# Patient Record
Sex: Male | Born: 1960 | Race: White | Hispanic: No | Marital: Single | Smoking: Never smoker
Health system: Southern US, Community
[De-identification: ages and names within clinical notes are randomized; demographics above are authoritative.]

## PROBLEM LIST (undated history)

## (undated) DIAGNOSIS — F419 Anxiety disorder, unspecified: Secondary | ICD-10-CM

## (undated) DIAGNOSIS — F1010 Alcohol abuse, uncomplicated: Secondary | ICD-10-CM

## (undated) DIAGNOSIS — G039 Meningitis, unspecified: Secondary | ICD-10-CM

## (undated) HISTORY — DX: Meningitis, unspecified: G03.9

## (undated) HISTORY — DX: Anxiety disorder, unspecified: F41.9

---

## 1983-06-01 HISTORY — PX: ANTERIOR CRUCIATE LIGAMENT REPAIR: SHX115

## 2016-08-08 IMAGING — CR DG ANKLE COMPLETE 3+V*R*
3 series · 3 of 3 positions shown · non-contrast
Comparison: None.

CLINICAL DATA: Ankle pain

EXAM:
RIGHT ANKLE - COMPLETE 3+ VIEW

[ankle ap]
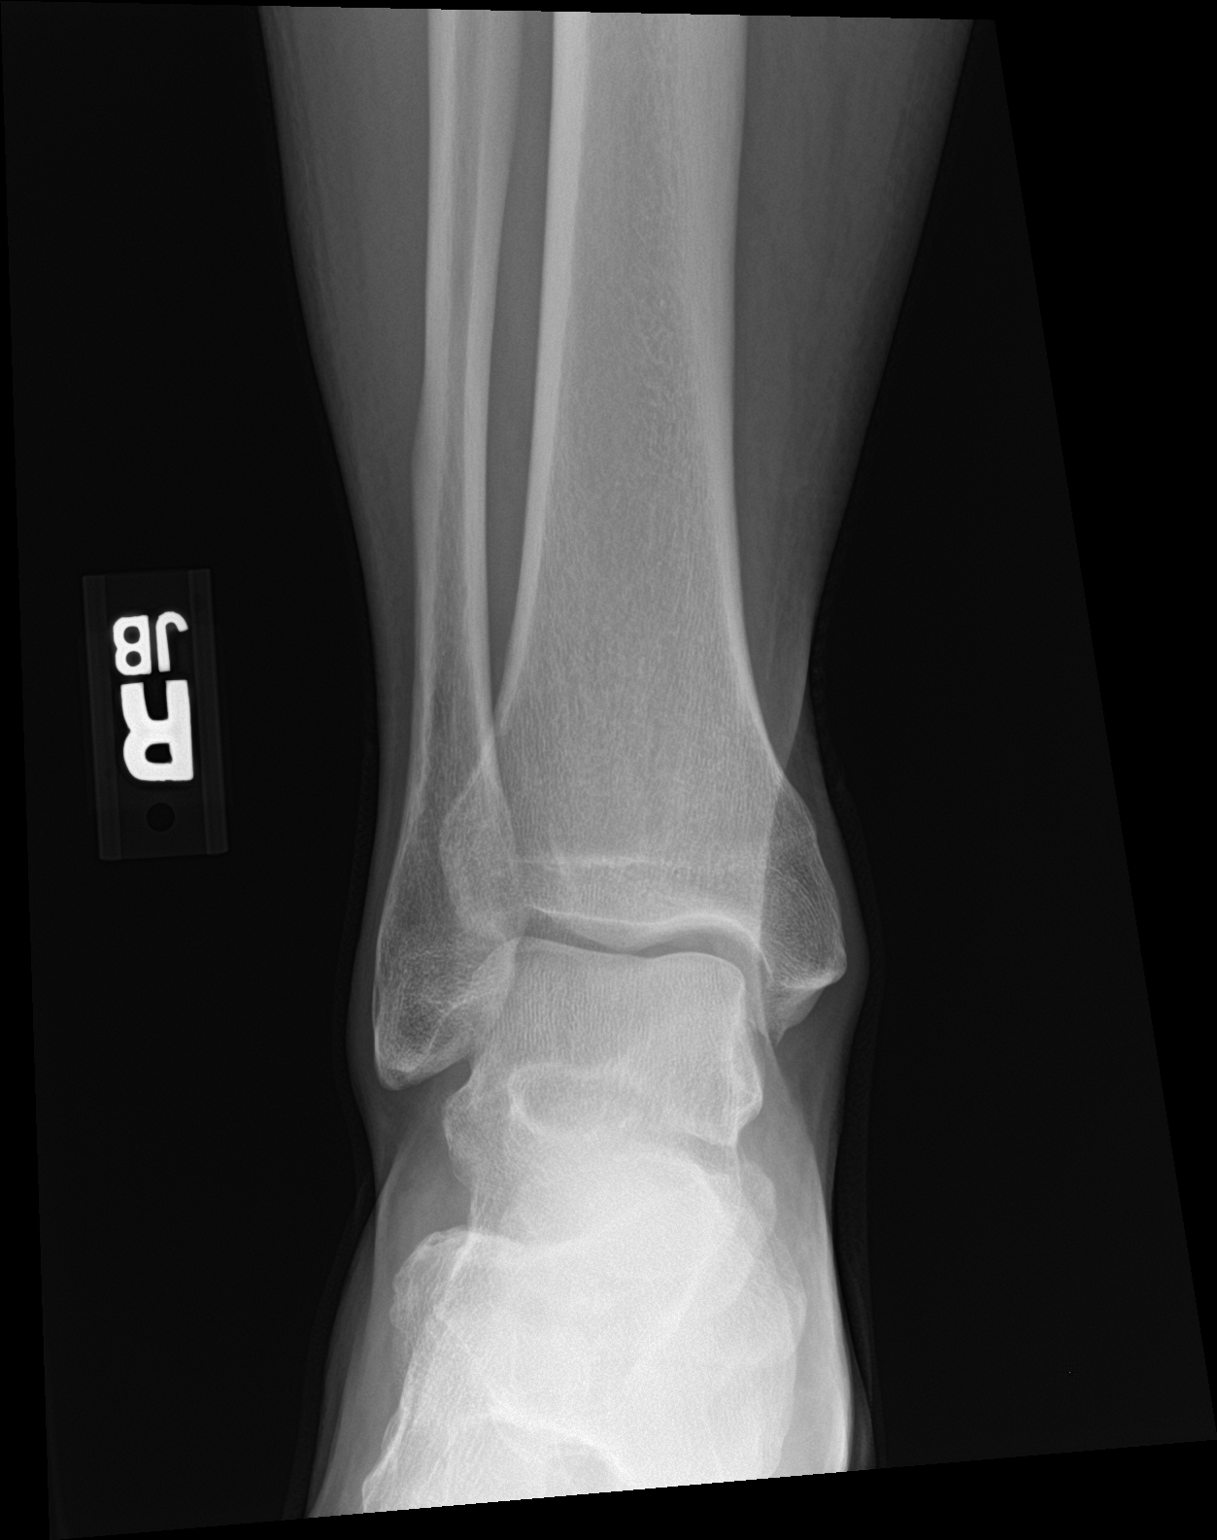

[ankle obl]
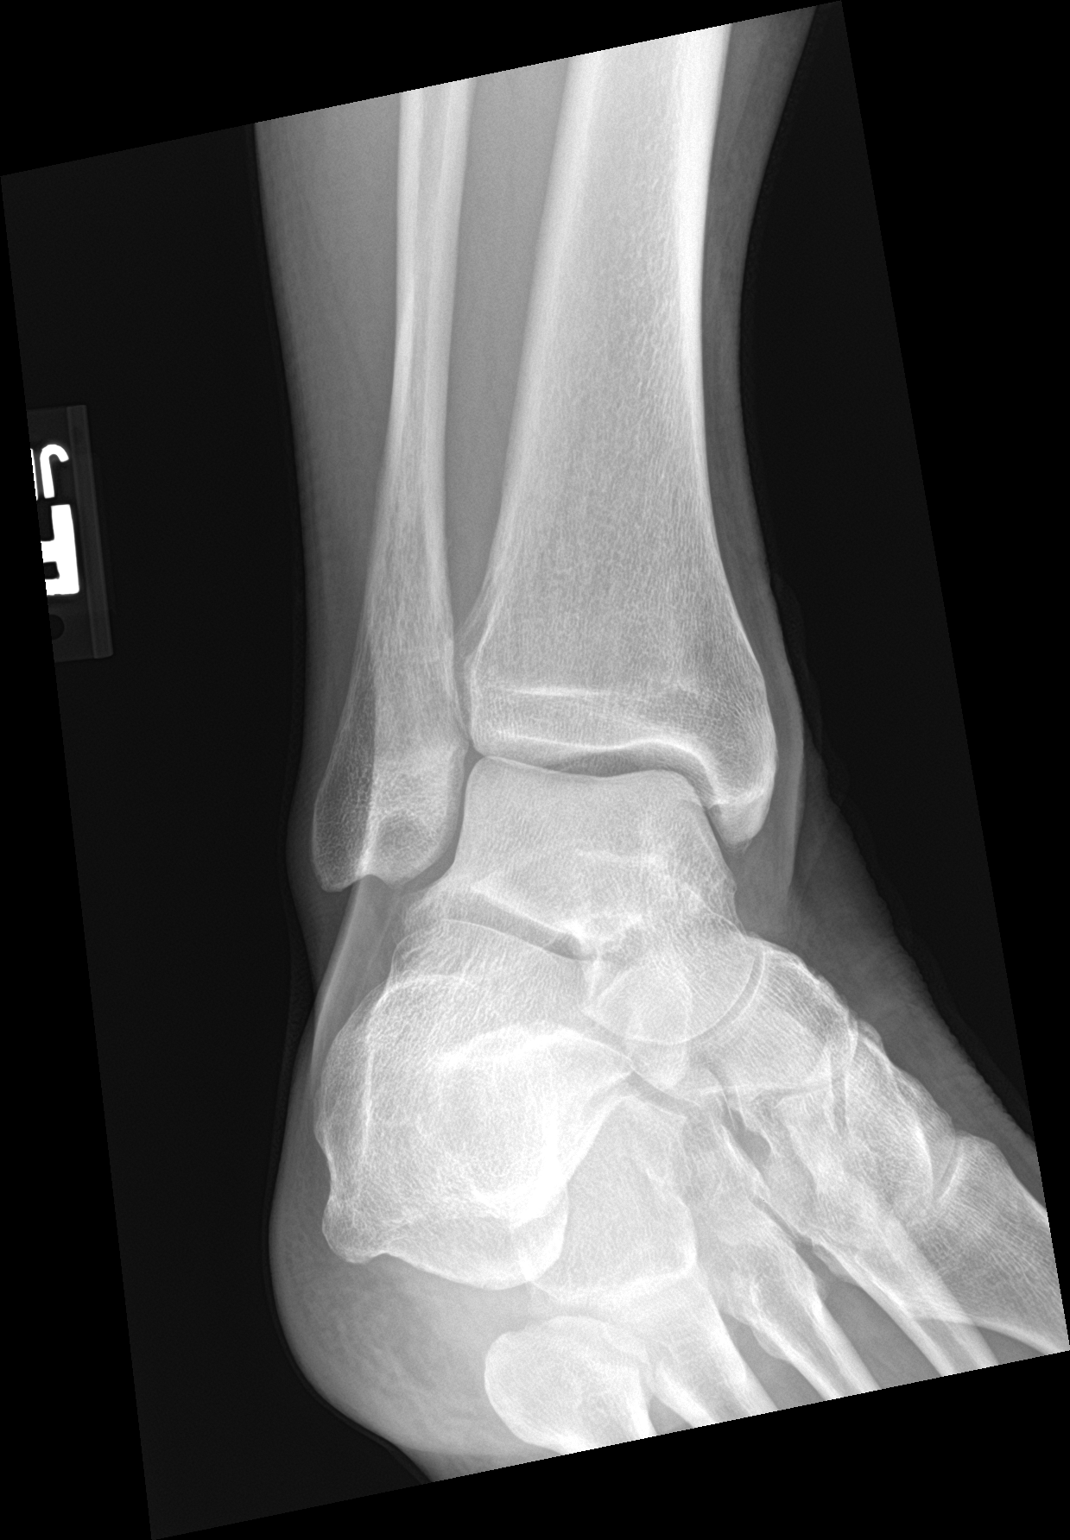

[ankle lat]
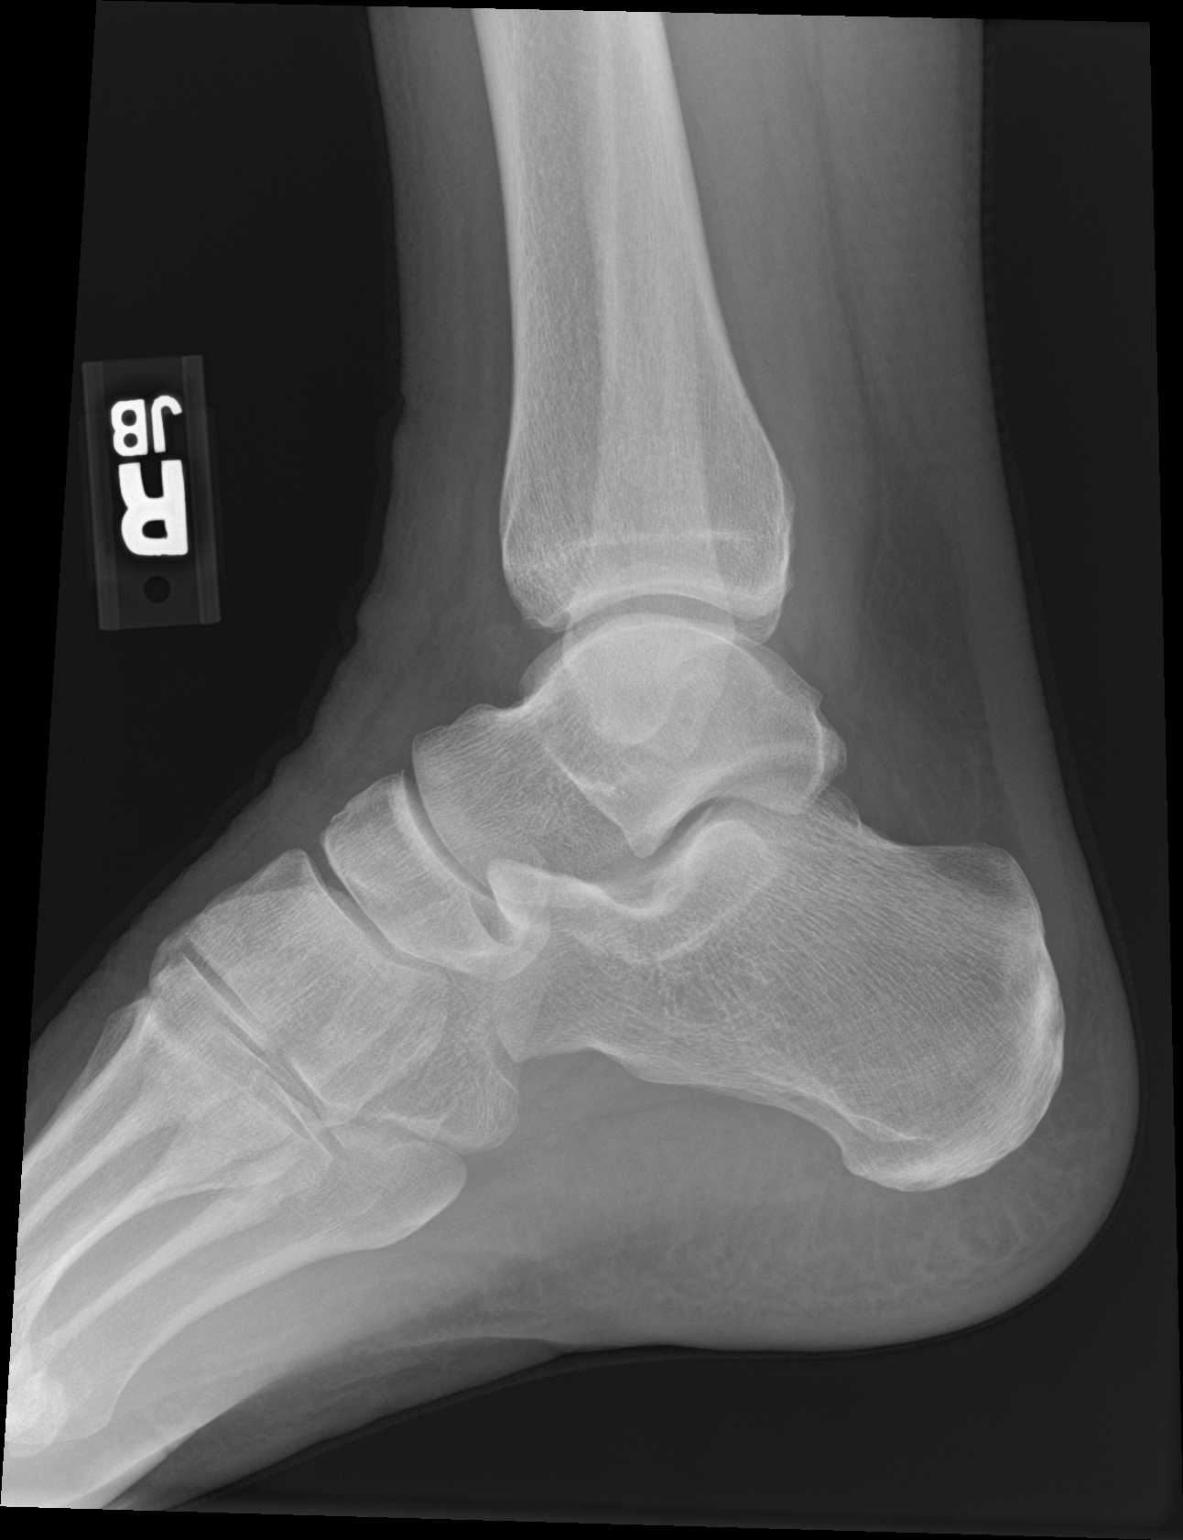

[3 of 3 positions shown; findings below may reference images not displayed]

FINDINGS: There is no evidence of fracture, dislocation, or joint effusion.
There is no evidence of arthropathy or other focal bone abnormality.
Soft tissues are unremarkable.
IMPRESSION: Negative.

## 2017-04-09 ENCOUNTER — Encounter (HOSPITAL_COMMUNITY): Payer: Self-pay | Admitting: *Deleted

## 2017-04-09 ENCOUNTER — Other Ambulatory Visit: Payer: Self-pay

## 2017-04-09 ENCOUNTER — Emergency Department (HOSPITAL_COMMUNITY): Payer: Self-pay

## 2017-04-09 ENCOUNTER — Emergency Department (HOSPITAL_COMMUNITY)
Admission: EM | Admit: 2017-04-09 | Discharge: 2017-04-09 | Disposition: A | Discharge status: Home / Self Care | Payer: Self-pay | Attending: Emergency Medicine | Admitting: Emergency Medicine

## 2017-04-09 DIAGNOSIS — Y9389 Activity, other specified: Secondary | ICD-10-CM | POA: Insufficient documentation

## 2017-04-09 DIAGNOSIS — Y929 Unspecified place or not applicable: Secondary | ICD-10-CM | POA: Insufficient documentation

## 2017-04-09 DIAGNOSIS — X509XXA Other and unspecified overexertion or strenuous movements or postures, initial encounter: Secondary | ICD-10-CM | POA: Insufficient documentation

## 2017-04-09 DIAGNOSIS — M25511 Pain in right shoulder: Secondary | ICD-10-CM

## 2017-04-09 DIAGNOSIS — M25571 Pain in right ankle and joints of right foot: Secondary | ICD-10-CM

## 2017-04-09 DIAGNOSIS — Y999 Unspecified external cause status: Secondary | ICD-10-CM | POA: Insufficient documentation

## 2017-04-09 IMAGING — CR DG SHOULDER 2+V*R*
3 series · 3 of 3 positions shown · non-contrast
Comparison: None.

CLINICAL DATA: Sharp pain to the shoulder after fall

EXAM:
RIGHT SHOULDER - 2+ VIEW

[shoulder grashey]
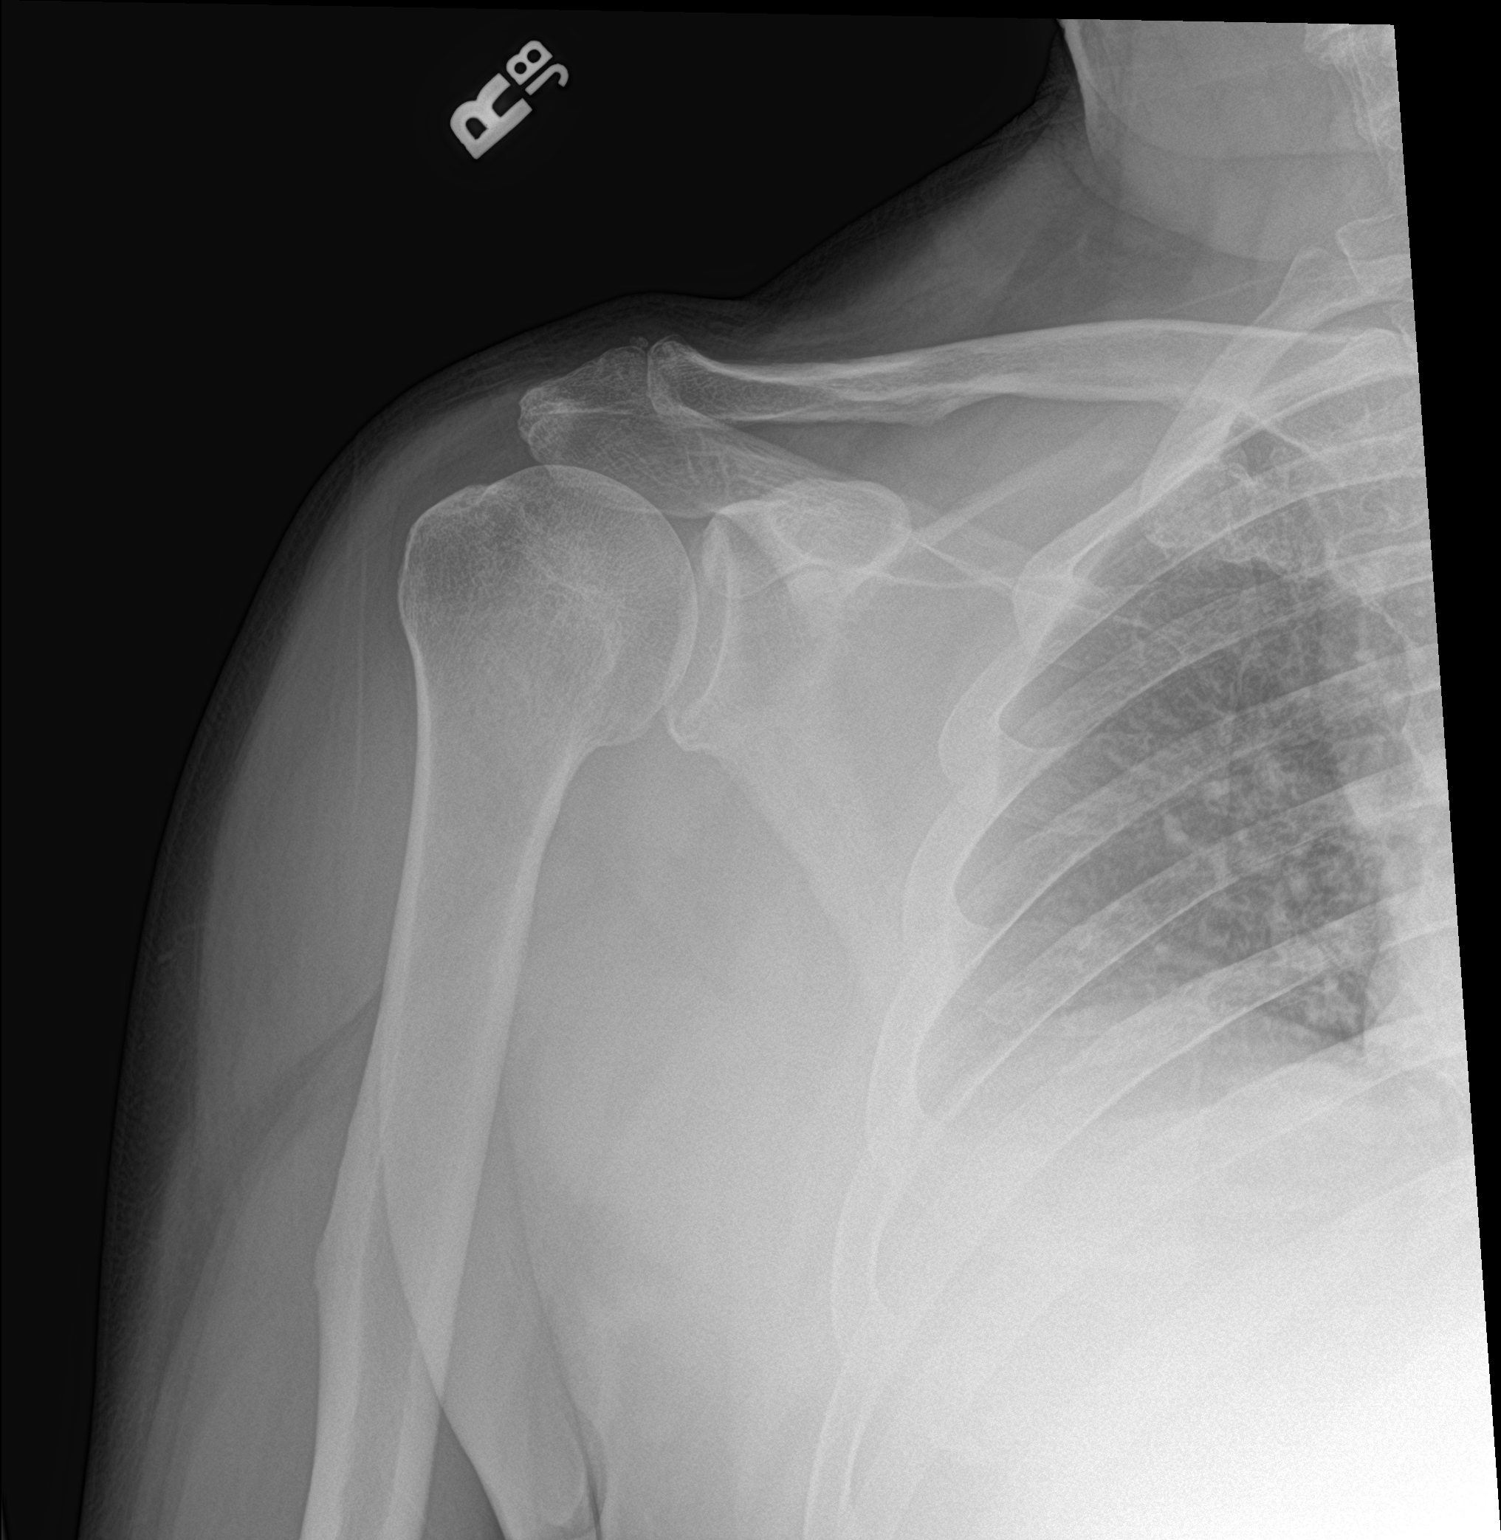

[shoulder y view]
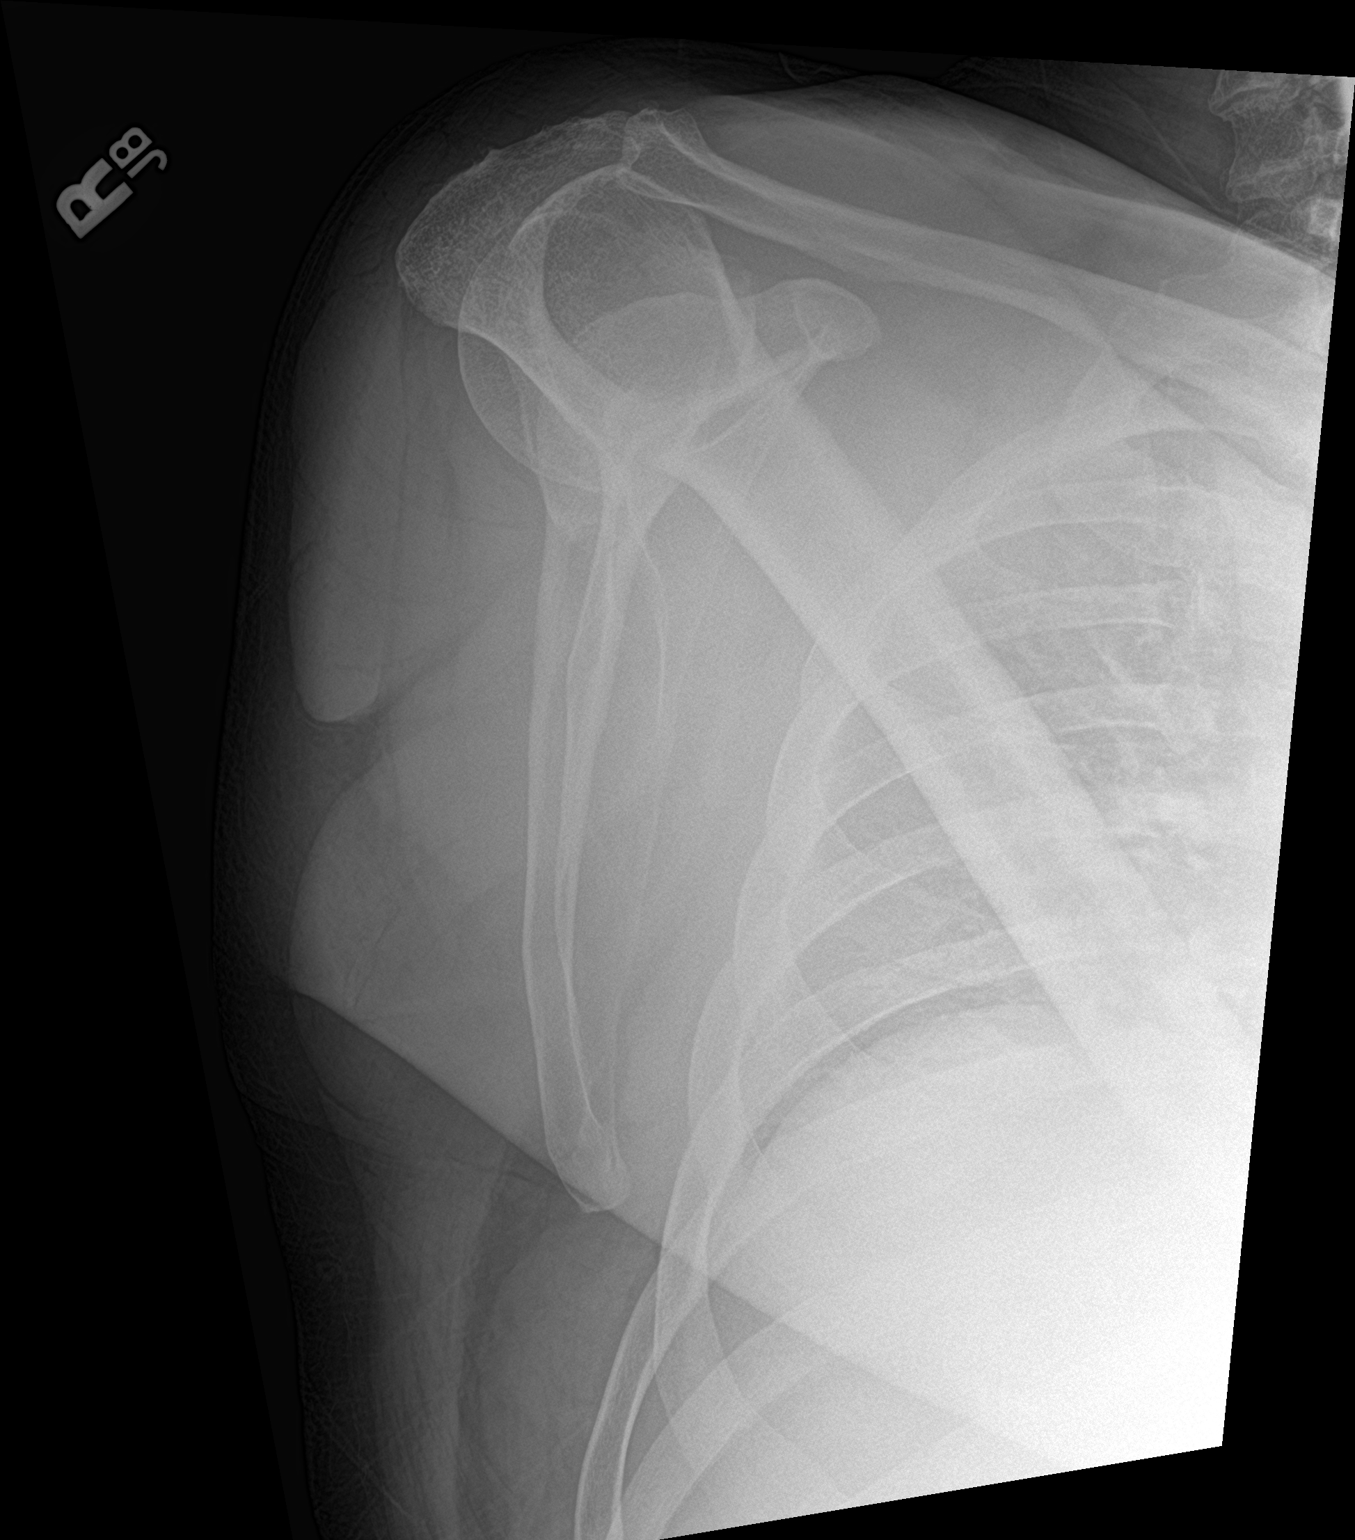

[shoulder axillary]
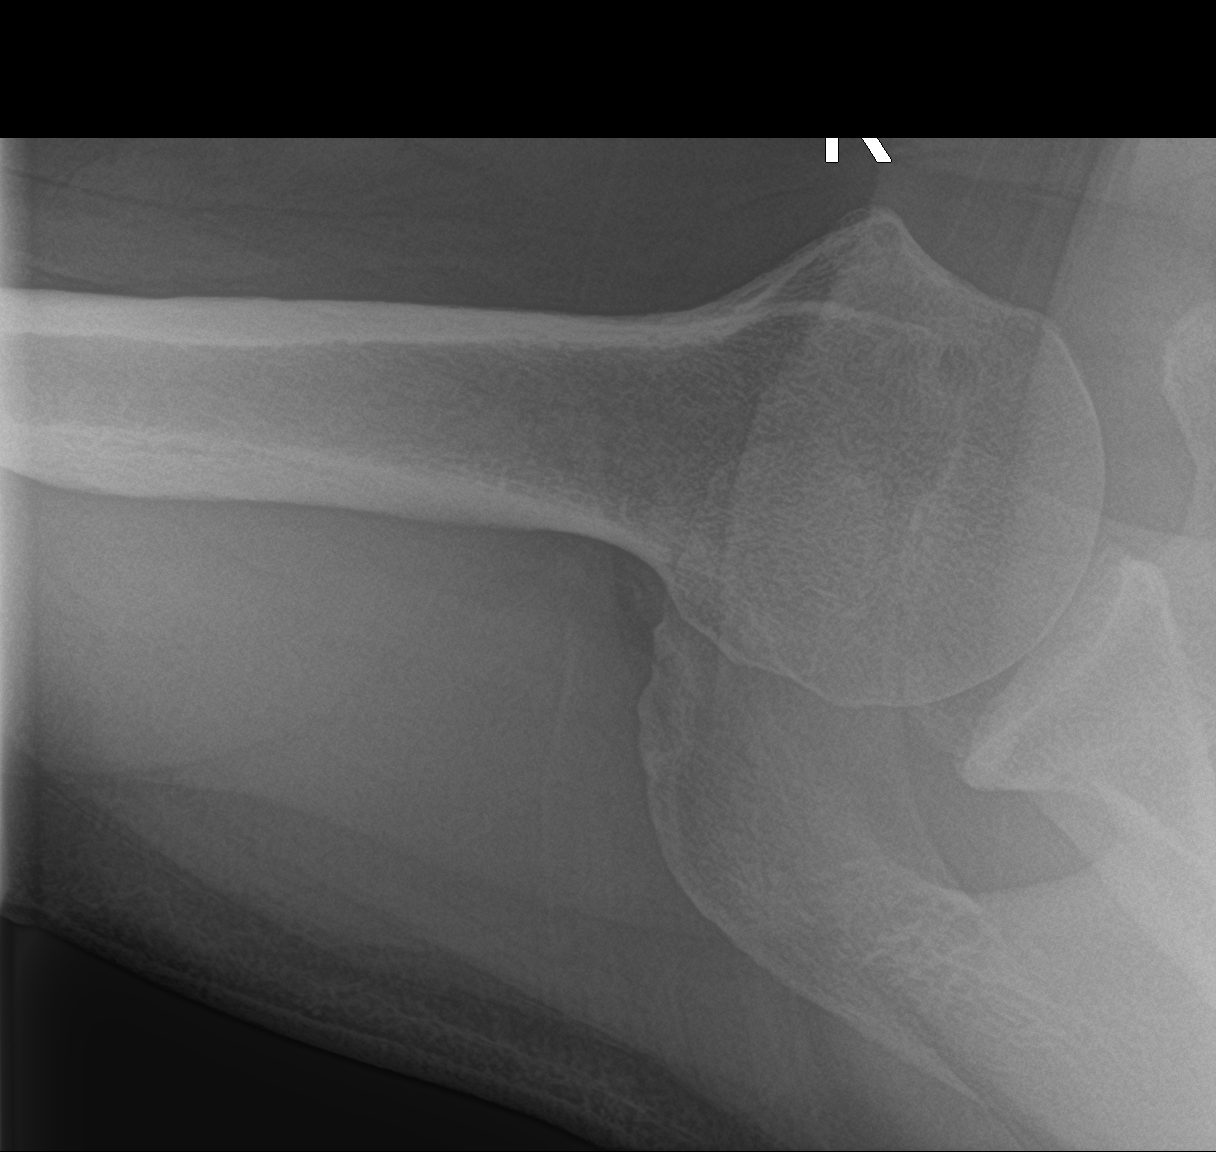

[3 of 3 positions shown; findings below may reference images not displayed]

FINDINGS: No dislocation. Mild AC joint degenerative change. Questionable
cortical fracture at the inferior aspect of the humeral head neck
junction.
IMPRESSION: Questionable cortical fracture of the inferior proximal humerus at
the head neck junction.

## 2017-04-09 MED ORDER — IBUPROFEN 600 MG PO TABS: 600.0000 mg | ORAL_TABLET | Freq: Three times a day (TID) | ORAL | 0 refills | Status: DC

## 2017-04-09 MED ORDER — HYDROCODONE-ACETAMINOPHEN 5-325 MG PO TABS: 1.0000 | ORAL_TABLET | Freq: Four times a day (QID) | ORAL | 0 refills | Status: DC | PRN

## 2017-04-09 NOTE — ED Notes (Signed)
Ortho tech paged  

## 2017-04-09 NOTE — ED Notes (Signed)
PA-C at bedside 

## 2017-04-09 NOTE — ED Triage Notes (Signed)
Pt reports onset of severe sharp pain to right posterior ankle pain/calf pain last night while walking. Pain increases when bending down. Pain caused him to fall and now has right shoulder pain.

## 2017-04-09 NOTE — ED Provider Notes (Signed)
[facility]   CSN: [id number] Arrival date & time: 04/09/17  1621     History   Chief Complaint Chief Complaint  Patient presents with  . Leg Pain  . Shoulder Pain    HPI Allen Cooke is a 56 y.o. male presenting with R ankle and r shoulder pain.   Pt states he was walking up the stairs when he hyperextended his R ankle and had acute onset of R lateral/posterior ankle pain. Due to this pain, he fell forward, putting his R am out, and injured his shoulder. This happened last night. Today he has had continuation of his pain. Ankle pain is sharp, described as a tearing sensation, and present with only certain movements of the ankle. No pain at rest. He has not taken anything for pain. His R shoulder pain is described as an ache, worse with abduction of the arm. He denies numbness or tingling. He denies pain elsewhere, he did not hit his head.  No recent antibiotics or steroids.  He has no other medical history, does not take medications daily.  HPI  History reviewed. No pertinent past medical history.  There are no active problems to display for this patient.   History reviewed. No pertinent surgical history.     Home Medications    Prior to Admission medications   Medication Sig Start Date End Date Taking? Authorizing Provider  HYDROcodone-acetaminophen (NORCO/VICODIN) 5-325 MG tablet Take 1 tablet every 6 (six) hours as needed by mouth for severe pain. 04/09/17   Avelardo Reesman, PA-C  ibuprofen (ADVIL,MOTRIN) 600 MG tablet Take 1 tablet (600 mg total) 3 (three) times daily with meals by mouth. 04/09/17   Cheney Ewart, PA-C    Family History History reviewed. No pertinent family history.  Social History Social History   Tobacco Use  . Smoking status: Never Smoker  Substance Use Topics  . Alcohol use: Yes    Frequency: Never    Comment: occ  . Drug use: No     Allergies   Patient has no known  allergies.   Review of Systems Review of Systems  Musculoskeletal: Positive for arthralgias.  Neurological: Negative for numbness.  Hematological: Does not bruise/bleed easily.     Physical Exam Updated Vital Signs BP (!) 157/108 (BP Location: Right Arm)   Pulse 70   Temp 97.7 F (36.5 C) (Oral)   Resp 16   SpO2 100%   Physical Exam  Constitutional: He is oriented to person, place, and time. He appears well-developed and well-nourished. No distress.  HENT:  Head: Normocephalic and atraumatic.  Eyes: EOM are normal.  Neck: Normal range of motion.  Pulmonary/Chest: Effort normal.  Abdominal: He exhibits no distension.  Musculoskeletal: He exhibits tenderness. He exhibits no edema.  Tenderness to palpation of the lateral right Achilles.  No tenderness to palpation of the malleolus.  Negative Thompson's test.  Achilles tendon palpable/intact.  Pedal pulses intact bilaterally.  Sensation intact bilaterally. Minimal tenderness palpation of the right shoulder.  No pain with active range of motion, increased pain with passive range of motion.  Pain with external rotation and with pronation of the forearm.  No visible deformity or laceration.  Radial pulses intact bilaterally.  Sensation intact bilaterally.  Grip strength equal bilaterally.  Soft compartments.  Neurological: He is alert and oriented to person, place, and time.  Skin: Skin is warm. No rash noted.  Psychiatric: He has a normal mood and affect.  Nursing note and  vitals reviewed.    ED Treatments / Results  Labs (all labs ordered are listed, but only abnormal results are displayed) Labs Reviewed - No data to display  EKG  EKG Interpretation None       Radiology Dg Shoulder Right  Result Date: 04/09/2017 CLINICAL DATA:  Sharp pain to the shoulder after fall EXAM: RIGHT SHOULDER - 2+ VIEW COMPARISON:  None. FINDINGS: No dislocation. Mild AC joint degenerative change. Questionable cortical fracture at the  inferior aspect of the humeral head neck junction. IMPRESSION: Questionable cortical fracture of the inferior proximal humerus at the head neck junction. Electronically Signed   By: Jasmine PangKim  Fujinaga M.D.   On: 04/09/2017 18:18   Dg Ankle Complete Right  Result Date: 04/09/2017 CLINICAL DATA:  Ankle pain EXAM: RIGHT ANKLE - COMPLETE 3+ VIEW COMPARISON:  None. FINDINGS: There is no evidence of fracture, dislocation, or joint effusion. There is no evidence of arthropathy or other focal bone abnormality. Soft tissues are unremarkable. IMPRESSION: Negative. Electronically Signed   By: Jasmine PangKim  Fujinaga M.D.   On: 04/09/2017 18:19    Procedures Procedures (including critical care time)  Medications Ordered in ED Medications - No data to display   Initial Impression / Assessment and Plan / ED Course  I have reviewed the triage vital signs and the nursing notes.  Pertinent labs & imaging results that were available during my care of the patient were reviewed by me and considered in my medical decision making (see chart for details).     Patient presenting with right ankle and right shoulder pain after injury yesterday.  Physical exam shows patient is neurovascularly intact.  X-ray of ankle negative for fracture dislocation.  Likely Achilles tendon strain or partial tear.  Achilles tendon is intact per physical exam.  Will place in cam walker and treat with NSAIDs.  X-ray of right shoulder shows possible for fracture.  Will place patient in splint and treat with NSAIDs and small course of Norco for breakthrough pain.  Patient to follow-up with orthopedics.  Case discussed with attending, Dr. Hyacinth MeekerMiller agrees to plan.  At this time, patient appears safe for discharge.  Return precautions given.  Patient states he understands and agrees to plan.   Final Clinical Impressions(s) / ED Diagnoses   Final diagnoses:  Acute right ankle pain  Acute pain of right shoulder    ED Discharge Orders        Ordered     ibuprofen (ADVIL,MOTRIN) 600 MG tablet  3 times daily with meals     04/09/17 1842    HYDROcodone-acetaminophen (NORCO/VICODIN) 5-325 MG tablet  Every 6 hours PRN     04/09/17 1842       Alveria ApleyCaccavale, Mamoru Takeshita, PA-C 04/10/17 0029    Eber HongMiller, Brian, MD 04/10/17 [id number]

## 2017-04-09 NOTE — Progress Notes (Signed)
Orthopedic Tech Progress Note Patient Details:  Ginnie SmartDaniel Orren [birth year] [id number]  Ortho Devices Type of Ortho Device: Arm sling, CAM walker Ortho Device/Splint Location: RUE, RLE Ortho Device/Splint Interventions: Application, Ordered   Jennye MoccasinHughes, Korvin Valentine Craig 04/09/2017, 6:55 PM

## 2017-04-09 NOTE — ED Notes (Signed)
Patient transported to x-ray. ?

## 2017-04-09 NOTE — Discharge Instructions (Signed)
Take ibuprofen 3 times a day with meals.  Do not take other anti-inflammatory at the same time (Advil, Motrin, naproxen, Aleve).  Supplement with Tylenol if you need further pain control. Use the Cam walker as needed for comfort.  You do not have to sleep in this.   Wear the shoulder sling as needed for comfort. Try to ice your ankle and shoulder as frequently as possible.  Ice your joints for 20 minutes at a time. Follow-up with orthopedic doctor next week for further evaluation. Return to the emergency room if you develop color change of your hand or foot, numbness of your hand or foot, significant increase in pain, or any new or worsening symptoms.

## 2017-04-09 NOTE — ED Notes (Signed)
Patient verbalized understanding of discharge instructions and denies any further needs or questions at this time. VS stable. Patient ambulatory with steady gait and use of cam walker. Pt assisted to ED entrance in wheelchair.

## 2017-04-12 ENCOUNTER — Ambulatory Visit (INDEPENDENT_AMBULATORY_CARE_PROVIDER_SITE_OTHER): Payer: Self-pay | Admitting: Orthopaedic Surgery

## 2017-04-12 ENCOUNTER — Encounter (INDEPENDENT_AMBULATORY_CARE_PROVIDER_SITE_OTHER): Payer: Self-pay | Admitting: Orthopaedic Surgery

## 2017-04-12 DIAGNOSIS — M25511 Pain in right shoulder: Secondary | ICD-10-CM

## 2017-04-12 DIAGNOSIS — S86011A Strain of right Achilles tendon, initial encounter: Secondary | ICD-10-CM

## 2017-04-12 MED ORDER — NAPROXEN 500 MG PO TABS: 500.0000 mg | ORAL_TABLET | Freq: Two times a day (BID) | ORAL | 3 refills | Status: DC

## 2017-04-12 NOTE — Progress Notes (Signed)
Office Visit Note   Patient: Allen Cooke           Date of Birth: [birth year]           MRN: [id number] Visit Date: 04/12/2017              Requested by: No referring provider defined for this encounter. PCP: System, Pcp Not In   Assessment & Plan: Visit Diagnoses:  1. Strain of right Achilles tendon, initial encounter   2. Acute pain of right shoulder     Plan: Impression is right kidney strain and right proximal humerus avulsion fracture.  Recommend Cam walker for a couple weeks and then wean as tolerated.  Referral to physical therapy for the Achilles and the shoulder.  I want him to begin range of motion and strengthening with the shoulder prevents.  Prescription for naproxen was given.  Questions encouraged and answered.  Follow-up as needed.  Follow-Up Instructions: Return if symptoms worsen or fail to improve.   Orders:  No orders of the defined types were placed in this encounter.  Meds ordered this encounter  Medications  . naproxen (NAPROSYN) 500 MG tablet    Sig: Take 1 tablet (500 mg total) 2 (two) times daily with a meal by mouth.    Dispense:  30 tablet    Refill:  3      Procedures: No procedures performed   Clinical Data: No additional findings.   Subjective: Chief Complaint  Patient presents with  . Right Shoulder - Pain  . Right Ankle - Pain    Patient is a 56 year old gentleman with right ankle and shoulder pain status post mechanical fall on 04/02/2017.  He complains of some discomfort with his right shoulder with abduction.  He also complains of bilateral Achilles pain.  He did not feel any pop or dislocations.  Denies any numbness and tingling or radiation of pain.    Review of Systems  Constitutional: Negative.   All other systems reviewed and are negative.    Objective: Vital Signs: There were no vitals taken for this visit.  Physical Exam  Constitutional: He is oriented to person, place, and time. He appears well-developed and  well-nourished.  HENT:  Head: Normocephalic and atraumatic.  Eyes: Pupils are equal, round, and reactive to light.  Neck: Neck supple.  Pulmonary/Chest: Effort normal.  Abdominal: Soft.  Musculoskeletal: Normal range of motion.  Neurological: He is alert and oriented to person, place, and time.  Skin: Skin is warm.  Psychiatric: He has a normal mood and affect. His behavior is normal. Judgment and thought content normal.  Nursing note and vitals reviewed.   Ortho Exam Right shoulder exam shows no focal findings.  He does have some discomfort with shoulder abduction.  Right ankle exam shows mild tenderness along the posterior lateral aspect of the Achilles.  There is no deficits or deformities of the Achilles.  He has good plantar flexion strength. Specialty Comments:  No specialty comments available.  Imaging: No results found.   PMFS History: Patient Active Problem List   Diagnosis Date Noted  . Strain of right Achilles tendon 04/12/2017  . Acute pain of right shoulder 04/12/2017   History reviewed. No pertinent past medical history.  History reviewed. No pertinent family history.  History reviewed. No pertinent surgical history. Social History   Occupational History  . Not on file  Tobacco Use  . Smoking status: Never Smoker  . Smokeless tobacco: Never Used  Substance and Sexual Activity  .  Alcohol use: Yes    Frequency: Never    Comment: occ  . Drug use: No  . Sexual activity: Not on file

## 2017-04-18 ENCOUNTER — Ambulatory Visit: Payer: Self-pay | Attending: Orthopaedic Surgery | Admitting: Physical Therapy

## 2017-04-18 ENCOUNTER — Encounter: Payer: Self-pay | Admitting: Physical Therapy

## 2017-04-18 DIAGNOSIS — M25571 Pain in right ankle and joints of right foot: Secondary | ICD-10-CM | POA: Insufficient documentation

## 2017-04-18 DIAGNOSIS — M25511 Pain in right shoulder: Secondary | ICD-10-CM | POA: Insufficient documentation

## 2017-04-18 DIAGNOSIS — M25671 Stiffness of right ankle, not elsewhere classified: Secondary | ICD-10-CM | POA: Insufficient documentation

## 2017-04-18 DIAGNOSIS — M25611 Stiffness of right shoulder, not elsewhere classified: Secondary | ICD-10-CM | POA: Insufficient documentation

## 2017-04-18 DIAGNOSIS — M6281 Muscle weakness (generalized): Secondary | ICD-10-CM | POA: Insufficient documentation

## 2017-04-18 NOTE — Patient Instructions (Addendum)
Ankle Circles    Slowly rotate right foot and ankle clockwise then counterclockwise. Gradually increase range of motion. Avoid pain. Circle _15___ times each direction per set. Do __1__ sets per session. Do ____ sessions per day. 3 http://orth.exer.us/30   Copyright  VHI. All rights reserved.  Ankle Pump    With left leg elevated, gently flex and extend ankle. Move through full range of motion. Avoid pain. Repeat ___15_ times per set. Do __1__ sets per session. Do _3___ sessions per day.  http://orth.exer.us/32   Copyright  VHI. All rights reserved.  AROM: Toe Curl    Sitting or lying with left heel supported, gently curl and straighten toes. Repeat _15___ times per set. Do __1__ sets per session. Do ___3_ sessions per day.  http://orth.exer.us/60   Copyright  VHI. All rights reserved.  ROM: Inversion / Eversion    With left leg relaxed, gently turn ankle and foot in and out. Move through full range of motion. Avoid pain. Repeat ___15_ times per set. Do __1__ sets per session. Do __3__ sessions per day.  http://orth.exer.us/36   Copyright  VHI. All rights reserved.  Flexion (Assistive)    Clasp hands together and raise arms above head, keeping elbows as straight as possible. Can be done sitting or lying. Repeat _10___ times. Do __3__ sessions per day.  Copyright  VHI. All rights reserved.  ROM: Pendulum (Circular)    Let right arm move in circle clockwise, then counterclockwise, by rocking body weight in circular pattern. Circle __10__ times each direction per set. Do __1__ sets per session. Do __3__ sessions per day.  http://orth.exer.us/794   Copyright  VHI. All rights reserved.  [facility] [address], [address] [place], [place] [address] Phone # [phone] Fax [phone]

## 2017-04-18 NOTE — Therapy (Addendum)
[facility] [address], [place], [place], [address] Phone: [phone]   Fax:  [phone]  Physical Therapy Evaluation  Patient Details  Name: Allen Cooke MRN: [id number] Date of Birth: [birth year] Referring Provider: Dr. Marylynn Pearson. Erlinda Hong   Encounter Date: 04/18/2017  PT End of Session - 04/18/17 0924    Visit Number  1    Date for PT Re-Evaluation  06/13/17    PT Start Time  0846    PT Stop Time  0925    PT Time Calculation (min)  39 min    Activity Tolerance  Patient tolerated treatment well    Behavior During Therapy  [facility] for tasks assessed/performed       Past Medical History:  Diagnosis Date  . Anxiety     Past Surgical History:  Procedure Laterality Date  . ANTERIOR CRUCIATE LIGAMENT REPAIR Right 1985    There were no vitals filed for this visit.   Subjective Assessment - 04/18/17 0855    Subjective  Patient was coming home from dinner and going up several steps in back door.  Patient took a wrong step and had a sharp pain in right ankle and caught himself with the right arm.  Patient hurts himself on 04/02/2017.     Patient Stated Goals  improve right shoulder ROM and improve right ankle strengthand ROM    Currently in Pain?  Yes    Pain Score  5     Pain Location  Shoulder    Pain Orientation  Right    Pain Descriptors / Indicators  Aching    Pain Type  Acute pain    Pain Onset  1 to 4 weeks ago    Pain Frequency  Intermittent    Aggravating Factors   sleep on right shoulder, bring arm out and up, supine with right shoulder flexion, using right arm    Pain Relieving Factors  arm at side    Multiple Pain Sites  Yes    Pain Score  10    Pain Location  Ankle    Pain Orientation  Right    Pain Type  Acute pain    Pain Onset  1 to 4 weeks ago    Aggravating Factors   stand and dorsiflexion, sit with bringing right foot under him, push on back of right knee feels pulling     Pain Relieving  Factors  no weight         [facility] PT Assessment - 04/18/17 0001      Assessment   Medical Diagnosis  right achilles strain, right prokimal humerus avulsion fracture    Referring Provider  Dr. Marylynn Pearson. Xu    Onset Date/Surgical Date  04/02/17    Prior Therapy  none      Precautions   Precautions  Other (comment)    Precaution Comments  scared of needles      Restrictions   Weight Bearing Restrictions  No      Balance Screen   Has the patient fallen in the past 6 months  Yes    How many times?  1 stepped wrong and hurt right ankle then went on floor    Has the patient had a decrease in activity level because of a fear of falling?   No    Is the patient reluctant to leave their home because of a fear of falling?   No      Home Environment  Living Environment  Private residence      Prior Function   Level of Independence  Independent    Vocation  Full time employment rice farm    Vocation Requirements  drive big tracture, walk on unlevel ground, using the right arm       Cognition   Overall Cognitive Status  Within Functional Limits for tasks assessed      Posture/Postural Control   Posture/Postural Control  No significant limitations      ROM / Strength   AROM / PROM / Strength  AROM;PROM;Strength      AROM   AROM Assessment Site  Shoulder;Ankle    Right/Left Shoulder  Right    Right Shoulder Flexion  150 Degrees    Right Shoulder ABduction  140 Degrees    Right Shoulder Internal Rotation  50 Degrees    Right Shoulder External Rotation  50 Degrees    Right/Left Ankle  Right    Right Ankle Dorsiflexion  5    Right Ankle Plantar Flexion  30    Right Ankle Inversion  5    Right Ankle Eversion  5      PROM   Right Shoulder Flexion  165 Degrees    Right Shoulder ABduction  170 Degrees    Right Shoulder Internal Rotation  65 Degrees    Right Shoulder External Rotation  60 Degrees    Right/Left Ankle  Right    Right Ankle Dorsiflexion  10    Right Ankle Plantar  Flexion  35    Right Ankle Eversion  8      Strength   Strength Assessment Site  Shoulder;Ankle    Right/Left Shoulder  Right    Right Shoulder Flexion  3/5    Right Shoulder Extension  4/5    Right Shoulder ABduction  2/5    Right Shoulder Internal Rotation  4/5    Right Shoulder External Rotation  4/5    Right/Left Ankle  Right    Right Ankle Dorsiflexion  2/5    Right Ankle Plantar Flexion  4/5    Right Ankle Inversion  3/5    Right Ankle Eversion  3/5      Transfers   Transfers  Not assessed      Ambulation/Gait   Ambulation/Gait  No             Objective measurements completed on examination: See above findings.              PT Education - 04/18/17 0924    Education provided  Yes    Education Details  right ankle and shoulder ROM exercises    Person(s) Educated  Patient    Methods  Explanation;Demonstration;Verbal cues;Handout    Comprehension  Verbalized understanding;Returned demonstration       PT Short Term Goals - 04/18/17 1026      PT SHORT TERM GOAL #1   Title  independent with initial HEP for shoulder and ankle    Time  4    Period  Weeks    Status  New    Target Date  05/16/17      PT SHORT TERM GOAL #2   Title  full right shoulder AROM with minimal pain    Time  4    Period  Weeks    Status  New    Target Date  05/16/17      PT SHORT TERM GOAL #3   Title  full right ankle dorsiflexion with  knee flexed    Time  4    Period  Weeks    Status  New    Target Date  05/16/17      PT SHORT TERM GOAL #4   Title  lay on right shoulder with pain decreased >/= 25%    Time  4    Period  Weeks    Status  New    Target Date  05/16/17        PT Long Term Goals - 04/18/17 1029      PT LONG TERM GOAL #1   Title  independent with HEP    Time  8    Period  Weeks    Status  New    Target Date  06/13/17      PT LONG TERM GOAL #2   Title  stand and reach forward with minimal pain due to right dorsiflexion >/= 20 degrees and  strength of right ankle is 5/5    Time  8    Period  Weeks    Status  New    Target Date  06/13/17      PT LONG TERM GOAL #3   Title  reach overhead with right arm to get a 5# item out of a shelf without difficulty due to right shoulder strength is 5/5    Time  8    Period  Weeks    Status  New    Target Date  06/13/17      PT LONG TERM GOAL #4   Title  reach out to the side to get an item in the back of the car seat with minimal pain due to full right shoulder abduction    Time  8    Period  Weeks    Status  New    Target Date  06/13/17      PT LONG TERM GOAL #5   Title  able to walk without a cam boot and no pain with minimal deficits due to full right ankle ROM and strength    Time  8    Period  Weeks    Status  New    Target Date  06/13/17             Plan - 04/18/17 0925    Clinical Impression Statement  Patient is a 56 year old male with right shoulder and right ankle pain.  Patient stepped wrong on right foot when he felt a pain and used his right arm to guide him to the floor on 04/02/2017.  Patient has a right proximal avulsion fracture and right achilles strain.  Patient wears a CAM boot on the right.  Patient lives in [place] and visiting in [place] till 07/2017.  Right shoulder pain is 5/10 with increased pain when he sleeps on right, raises right arm overhead, lifts with right arm and reach out to the side.  Right ankle pain increases with weightbear plantarflexion movements at level 10/10.  Patient has decreased right shoulder and ankle ROM.  Right shoulder strength averages 3/5 and right ankle strength averages 4/5.  Patient will benefit from skilled therapy to improve right shoulder and ankle ROM and strength to return to prior functional mobility.     History and Personal Factors relevant to plan of care:  right proximal humerus avulsion fracture 04/02/2017    Clinical Presentation  Stable    Clinical Presentation due to:  stable condition    Clinical Decision  Making  Low    Rehab Potential  Excellent    Clinical Impairments Affecting Rehab Potential  right proximal humerus avulsion fracture 04/02/2017    PT Frequency  3x / week could be 2 times per week dependent on insurance    PT Duration  8 weeks    PT Treatment/Interventions  Cryotherapy;Electrical Stimulation;Iontophoresis 58m/ml Dexamethasone;Moist Heat;Ultrasound;Therapeutic exercise;Therapeutic activities;Gait training;Neuromuscular re-education;Patient/family education;Passive range of motion;Manual techniques;Taping    PT Next Visit Plan  right AROM/AAROM shoulder; right ankle isometrics; stretches to right ankle; Overhead pulleys, UE Ranger    PT Home Exercise Plan  progress as needed    Consulted and Agree with Plan of Care  Patient       Patient will benefit from skilled therapeutic intervention in order to improve the following deficits and impairments:  Abnormal gait, Increased fascial restricitons, Pain, Decreased mobility, Increased muscle spasms, Decreased strength, Decreased range of motion, Decreased activity tolerance, Decreased endurance  Visit Diagnosis: Pain in right ankle and joints of right foot - Plan: PT plan of care cert/re-cert  Stiffness of right ankle, not elsewhere classified - Plan: PT plan of care cert/re-cert  Acute pain of right shoulder - Plan: PT plan of care cert/re-cert  Stiffness of right shoulder, not elsewhere classified - Plan: PT plan of care cert/re-cert  Muscle weakness (generalized) - Plan: PT plan of care cert/re-cert     Problem List Patient Active Problem List   Diagnosis Date Noted  . Strain of right Achilles tendon 04/12/2017  . Acute pain of right shoulder 04/12/2017    CEarlie Counts PT 04/18/17 11:03 AM   [facility] [address] [place] [address]Phone: [phone]  Fax:  [phone] Name: Allen OstermillerMRN: [id number] of Birth:  [birth year]PHYSICAL THERAPY DISCHARGE SUMMARY  Visits from Start of Care: 1  Current functional level related to goals / functional outcomes: Patient has not returned since the initial evaluation.  He has no-showed for his last 2 visits without a phone call.    Remaining deficits: See above.    Education / Equipment: HEP Plan:                                                    Patient goals were not met. Patient is being discharged due to not returning since the last visit. Thank you for the review. CEarlie Counts PT 04/28/17 2:33 PM   ?????

## 2017-04-26 ENCOUNTER — Telehealth: Payer: Self-pay | Admitting: Physical Therapy

## 2017-04-26 ENCOUNTER — Ambulatory Visit: Payer: Self-pay | Admitting: Physical Therapy

## 2017-04-26 NOTE — Telephone Encounter (Signed)
Left message regarding no-show for today's appt.  Reminded of next scheduled appt on 11/29

## 2017-04-28 ENCOUNTER — Ambulatory Visit: Payer: Self-pay | Admitting: Physical Therapy

## 2017-05-02 ENCOUNTER — Ambulatory Visit: Payer: Self-pay | Admitting: Physical Therapy

## 2017-05-03 ENCOUNTER — Encounter: Payer: Self-pay | Admitting: Physical Therapy

## 2017-05-05 ENCOUNTER — Encounter: Payer: Self-pay | Admitting: Physical Therapy

## 2020-10-14 ENCOUNTER — Ambulatory Visit (HOSPITAL_COMMUNITY): Payer: Self-pay

## 2021-01-16 ENCOUNTER — Other Ambulatory Visit: Payer: Self-pay

## 2021-01-16 ENCOUNTER — Emergency Department (HOSPITAL_COMMUNITY): Payer: Self-pay

## 2021-01-16 ENCOUNTER — Emergency Department (HOSPITAL_COMMUNITY)
Admission: EM | Admit: 2021-01-16 | Discharge: 2021-01-17 | Disposition: A | Discharge status: Home / Self Care | Payer: Self-pay | Attending: Emergency Medicine | Admitting: Emergency Medicine

## 2021-01-16 ENCOUNTER — Encounter (HOSPITAL_COMMUNITY): Payer: Self-pay | Admitting: Emergency Medicine

## 2021-01-16 DIAGNOSIS — R791 Abnormal coagulation profile: Secondary | ICD-10-CM | POA: Insufficient documentation

## 2021-01-16 DIAGNOSIS — H53149 Visual discomfort, unspecified: Secondary | ICD-10-CM | POA: Insufficient documentation

## 2021-01-16 DIAGNOSIS — R202 Paresthesia of skin: Secondary | ICD-10-CM | POA: Insufficient documentation

## 2021-01-16 DIAGNOSIS — R519 Headache, unspecified: Secondary | ICD-10-CM | POA: Insufficient documentation

## 2021-01-16 DIAGNOSIS — I10 Essential (primary) hypertension: Secondary | ICD-10-CM

## 2021-01-16 LAB — COMPREHENSIVE METABOLIC PANEL
ALT: 55 U/L — ABNORMAL HIGH (ref 0–44)
AST: 29 U/L (ref 15–41)
Albumin: 3.8 g/dL (ref 3.5–5.0)
Alkaline Phosphatase: 55 U/L (ref 38–126)
Anion gap: 12 (ref 5–15)
BUN: 10 mg/dL (ref 6–20)
CO2: 19 mmol/L — ABNORMAL LOW (ref 22–32)
Calcium: 8.9 mg/dL (ref 8.9–10.3)
Chloride: 103 mmol/L (ref 98–111)
Creatinine, Ser: 0.89 mg/dL (ref 0.61–1.24)
GFR, Estimated: >60 mL/min (ref >60)
Glucose, Bld: 117 mg/dL — ABNORMAL HIGH (ref 70–99)
Potassium: 3.9 mmol/L (ref 3.5–5.1)
Sodium: 134 mmol/L — ABNORMAL LOW (ref 135–145)
Total Bilirubin: 1 mg/dL (ref 0.3–1.2)
Total Protein: 6.8 g/dL (ref 6.5–8.1)

## 2021-01-16 LAB — I-STAT CHEM 8, ED
BUN: 10 mg/dL (ref 6–20)
Calcium, Ion: 1.16 mmol/L (ref 1.15–1.40)
Chloride: 106 mmol/L (ref 98–111)
Creatinine, Ser: 0.8 mg/dL (ref 0.61–1.24)
Glucose, Bld: 120 mg/dL — ABNORMAL HIGH (ref 70–99)
HCT: 46 % (ref 39.0–52.0)
Hemoglobin: 15.6 g/dL (ref 13.0–17.0)
Potassium: 4 mmol/L (ref 3.5–5.1)
Sodium: 140 mmol/L (ref 135–145)
TCO2: 23 mmol/L (ref 22–32)

## 2021-01-16 LAB — PROTIME-INR
INR: 0.9 (ref 0.8–1.2)
Prothrombin Time: 12.4 seconds (ref 11.4–15.2)

## 2021-01-16 LAB — CBC WITH DIFFERENTIAL/PLATELET
Abs Immature Granulocytes: 0.01 10*3/uL (ref 0.00–0.07)
Basophils Absolute: 0.1 10*3/uL (ref 0.0–0.1)
Basophils Relative: 1 %
Eosinophils Absolute: 0.2 10*3/uL (ref 0.0–0.5)
Eosinophils Relative: 3 %
HCT: 46.7 % (ref 39.0–52.0)
Hemoglobin: 15.4 g/dL (ref 13.0–17.0)
Immature Granulocytes: 0 %
Lymphocytes Relative: 31 %
Lymphs Abs: 1.5 10*3/uL (ref 0.7–4.0)
MCH: 30.3 pg (ref 26.0–34.0)
MCHC: 33 g/dL (ref 30.0–36.0)
MCV: 91.7 fL (ref 80.0–100.0)
Monocytes Absolute: 0.4 10*3/uL (ref 0.1–1.0)
Monocytes Relative: 8 %
Neutro Abs: 2.7 10*3/uL (ref 1.7–7.7)
Neutrophils Relative %: 57 %
Platelets: 202 10*3/uL (ref 150–400)
RBC: 5.09 MIL/uL (ref 4.22–5.81)
RDW: 14 % (ref 11.5–15.5)
WBC: 4.8 10*3/uL (ref 4.0–10.5)
nRBC: 0 % (ref 0.0–0.2)

## 2021-01-16 IMAGING — CT CT HEAD W/O CM
4 series · 16 of 47 positions shown, 18 images · non-contrast
Comparison: None.

CLINICAL DATA: Right-sided headache worse with movements. Endorses
pressure behind eyes.

EXAM:
CT HEAD WITHOUT CONTRAST
TECHNIQUE: Contiguous axial images were obtained from the base of the skull
through the vertex without intravenous contrast.

[Series 3: head without · axial · non-contrast · 0.47mm/px · z∈[-144,-19]mm · 7 of 35 slices shown, 9 images]
[im 5/35  brain]
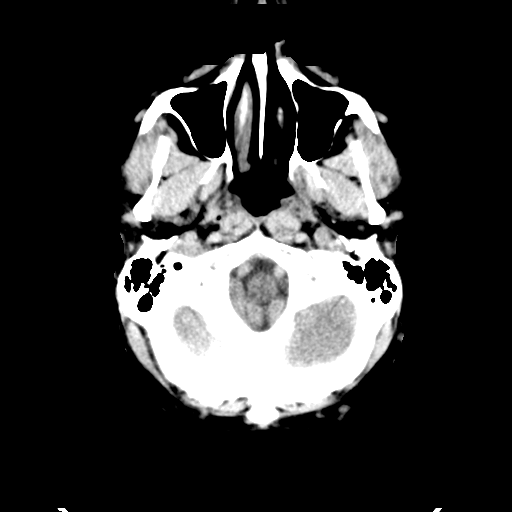
[im 5/35  bone]
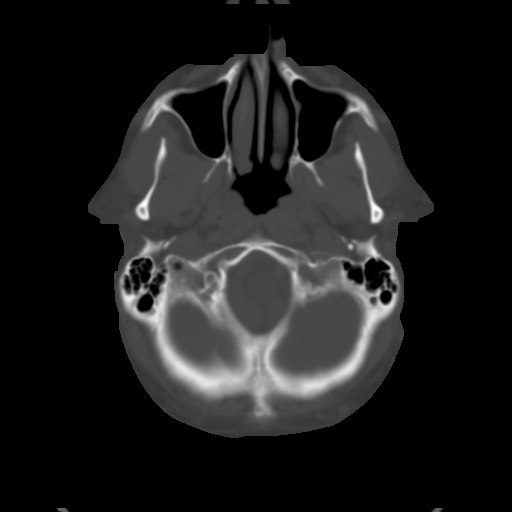
[im 9/35  brain]
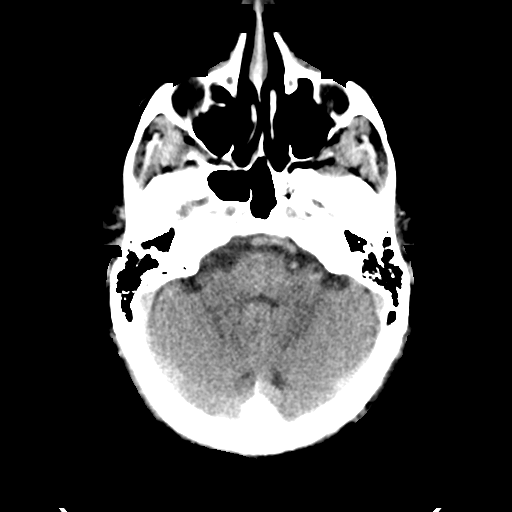
[im 13/35  brain]
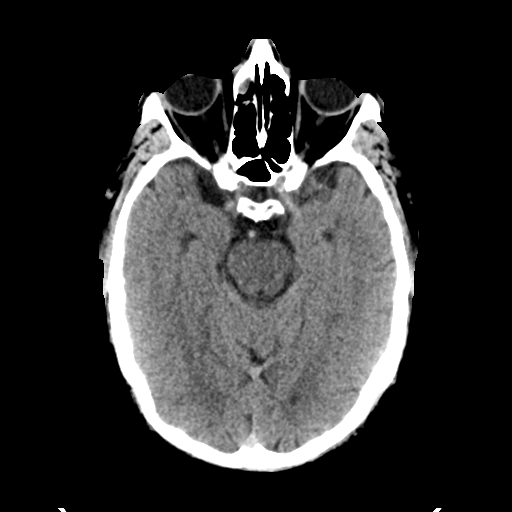
[im 18/35  brain]
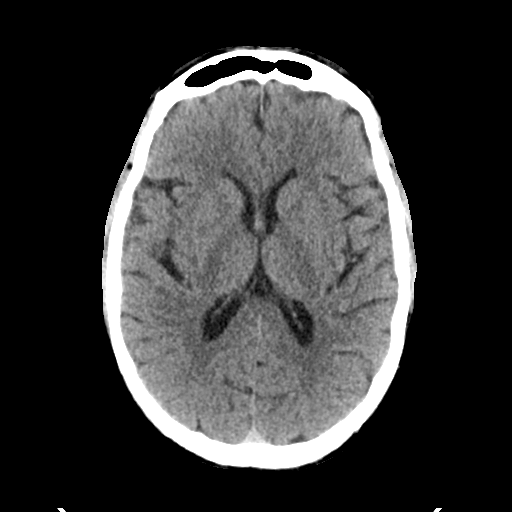
[im 22/35  brain]
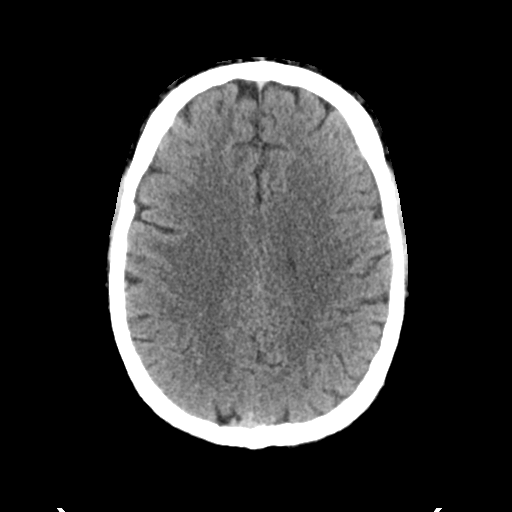
[im 22/35  bone]
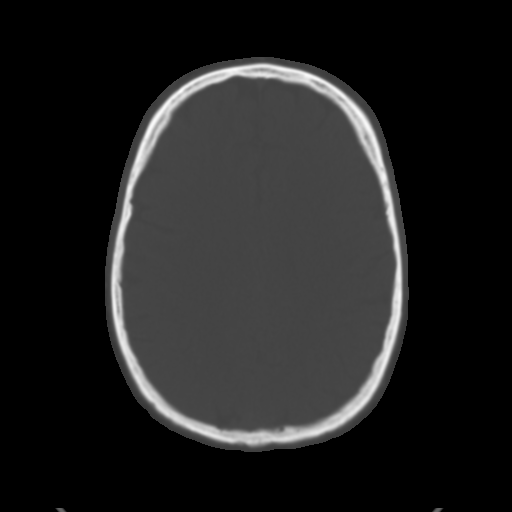
[im 26/35  brain]
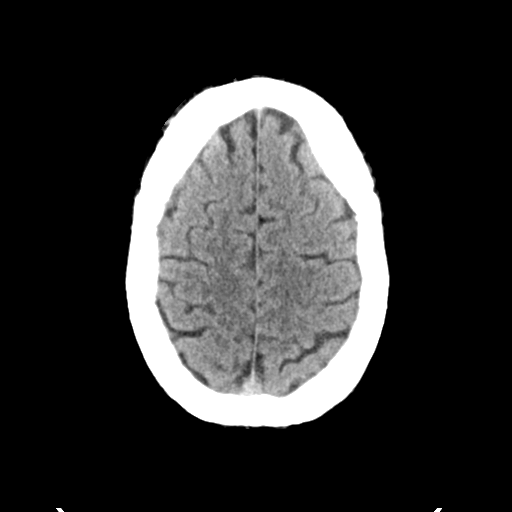
[im 30/35  brain]
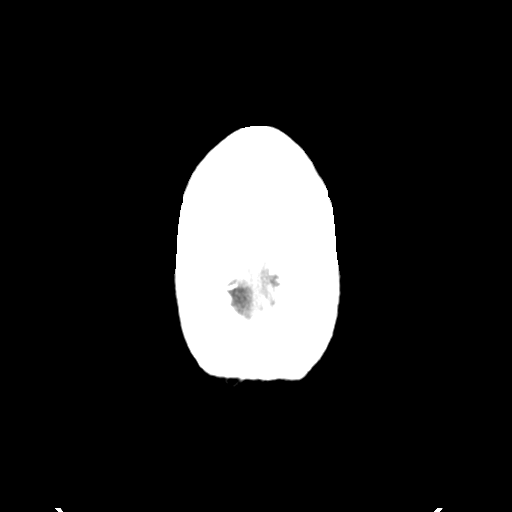

[Series 4: head bone · axial · 0.47mm/px · z∈[-148,-114]mm · 3 of 86 slices shown]
[im 9/86  bone]
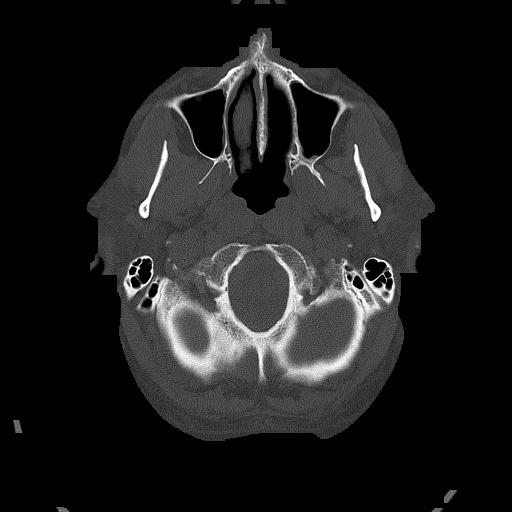
[im 18/86  bone]
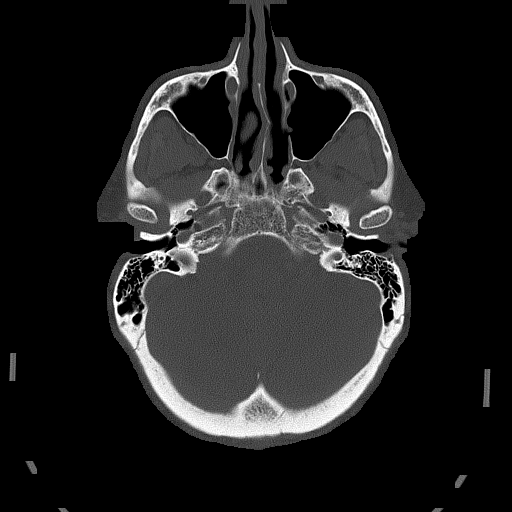
[im 26/86  bone]
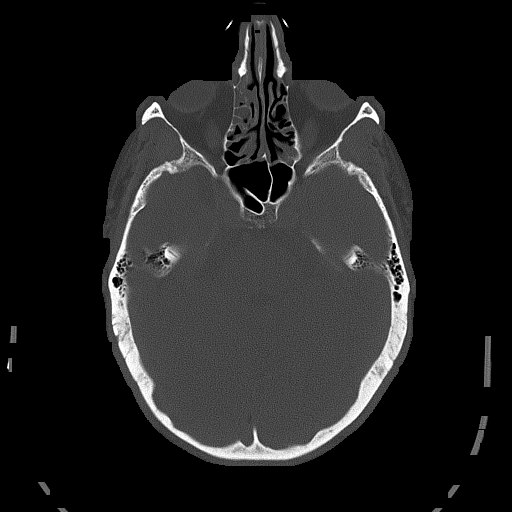

[Series 5: head without cor · coronal · non-contrast · 0.32mm/px · 3 of 76 slices shown]
[im 26/76  brain]
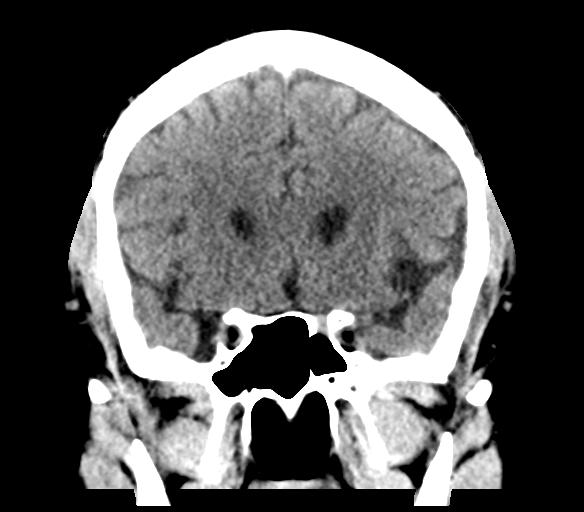
[im 34/76  brain]
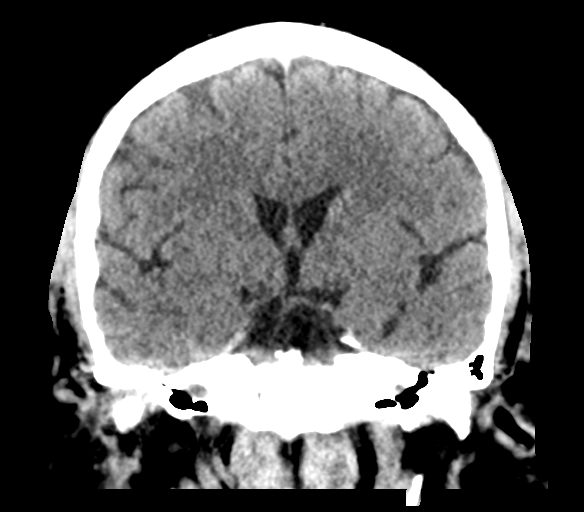
[im 42/76  brain]
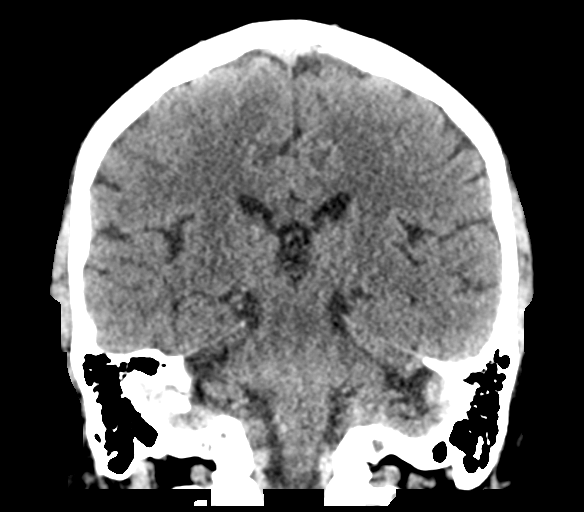

[Series 6: head without sag · sagittal · non-contrast · 0.32mm/px · 3 of 61 slices shown]
[im 21/61  brain]
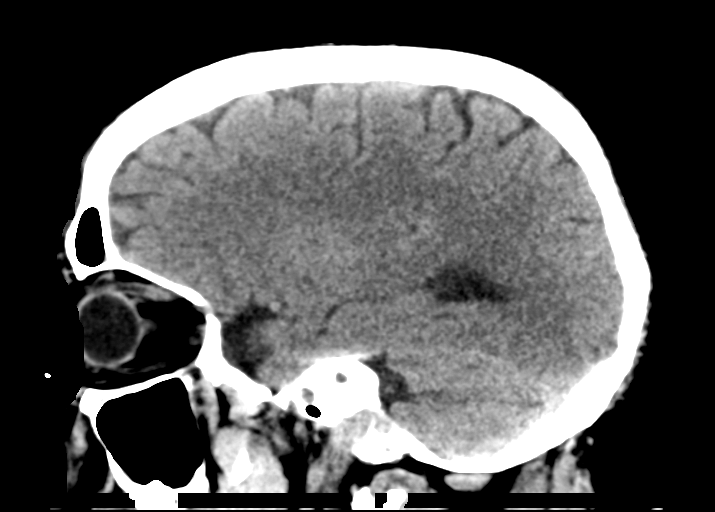
[im 31/61  brain]
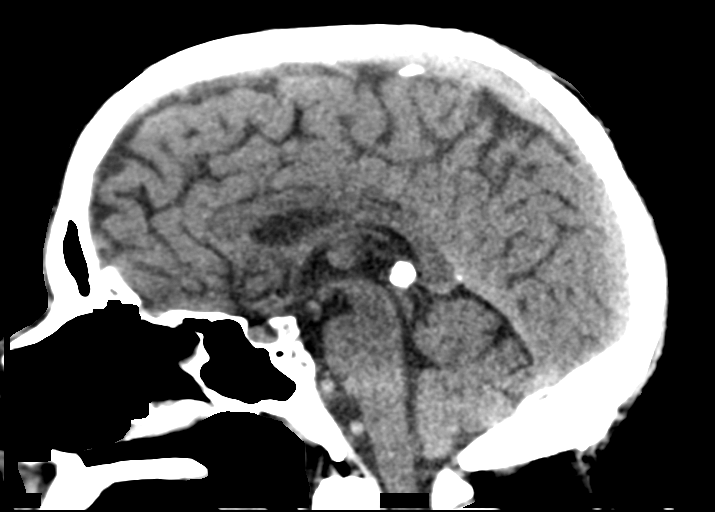
[im 41/61  brain]
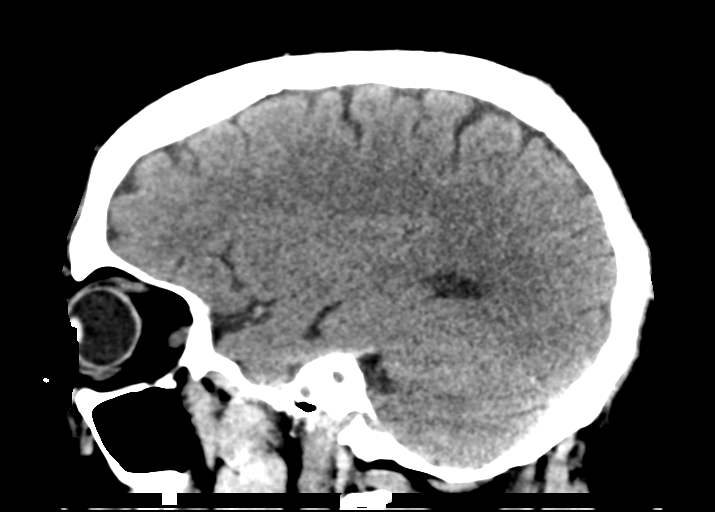

[16 of 47 positions shown; findings below may reference images not displayed]

FINDINGS: Brain:

No evidence of large-territorial acute infarction. No parenchymal
hemorrhage. No mass lesion. No extra-axial collection.

No mass effect or midline shift. No hydrocephalus. Basilar cisterns
are patent.

Vascular: No hyperdense vessel.

Skull: No acute fracture or focal lesion.

Sinuses/Orbits: Mild mucosal thickening of bilateral ethmoid,
maxillary, and right frontal sinuses. Otherwise remaining paranasal
sinuses and mastoid air cells are clear. The orbits are
unremarkable.

Other: None.
IMPRESSION: 1. No acute intracranial abnormality.
2. Mild mucosal thickening of bilateral ethmoid, maxillary, and
right frontal sinuses. Correlate with signs and symptoms of acute
sinusitis.

## 2021-01-16 IMAGING — MR MR HEAD W/O CM
6 of 11 series · 25 of 48 positions shown · non-contrast
Comparison: None.

CLINICAL DATA: Neuro deficit, acute, stroke suspected

EXAM:
MRI HEAD WITHOUT CONTRAST
TECHNIQUE: Multiplanar, multiecho pulse sequences of the brain and surrounding
structures were obtained without intravenous contrast.

[Series 3: DWI · axial · 3.0mm · 0.94mm/px · z∈[-64,+85]mm · 7 of 101 slices shown (1 of 2)]
[im 1/101]
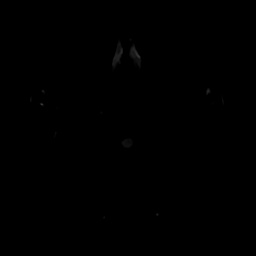
[im 17/101]
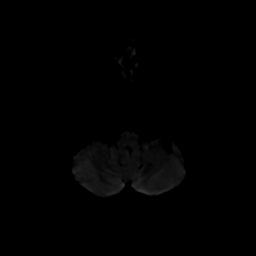
[im 34/101]
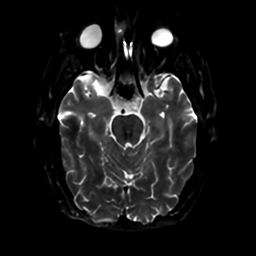
[im 51/101]
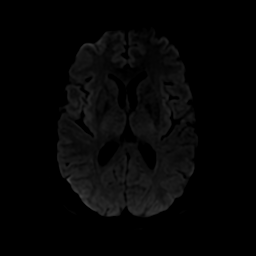
[im 67/101]
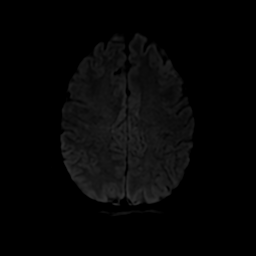
[im 84/101]
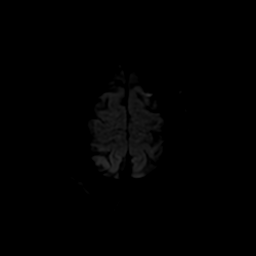
[im 101/101]
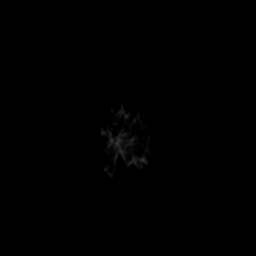

[Series 4: DWI · coronal · 4.0mm · 0.94mm/px · 6 of 79 slices shown (2 of 2)]
[im 1/79]
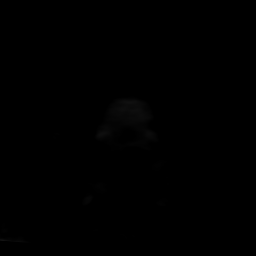
[im 16/79]
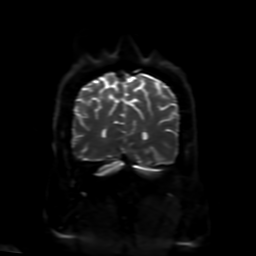
[im 32/79]
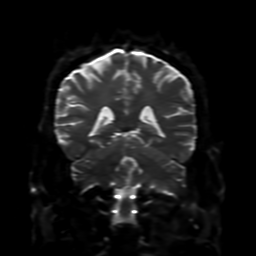
[im 47/79]
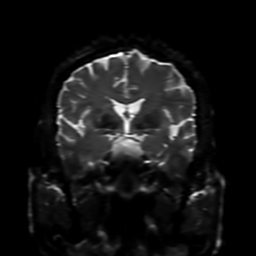
[im 63/79]
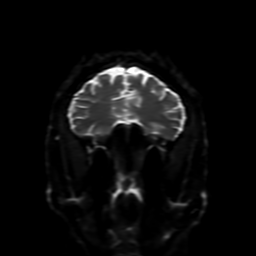
[im 79/79]
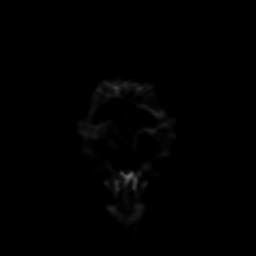

[Series 5: FLAIR · sagittal · 5.0mm · 0.23mm/px · 2 of 29 slices shown (1 of 2)]
[im 1/29]
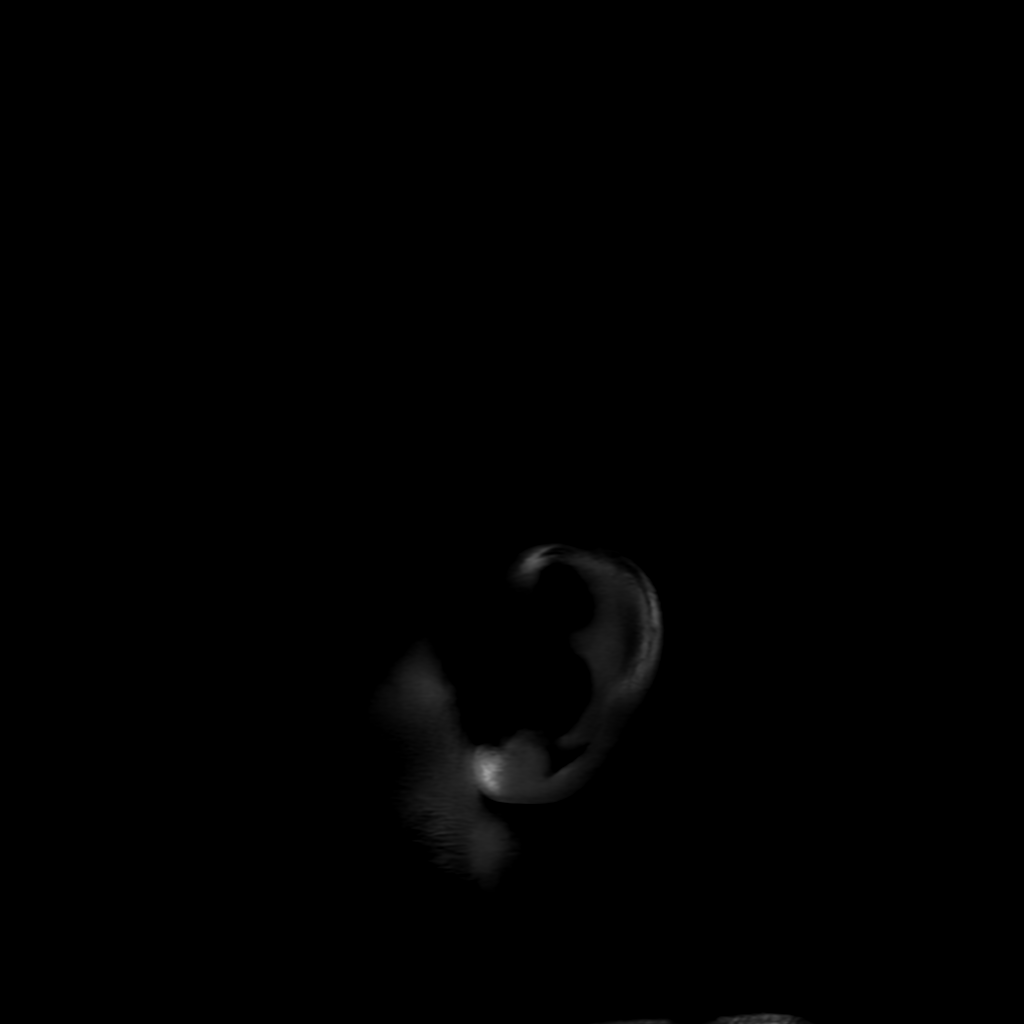
[im 29/29]
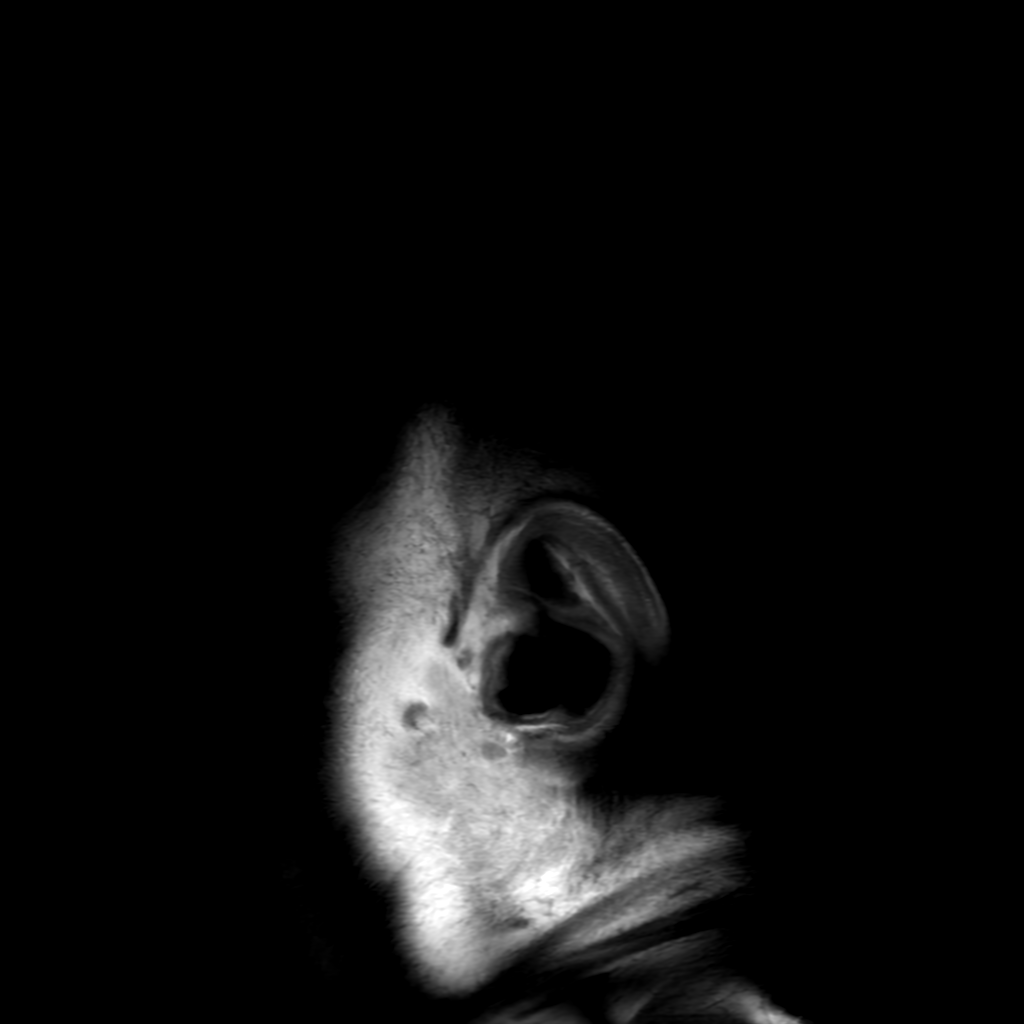

[Series 7: FLAIR · axial · 4.0mm · 0.45mm/px · z∈[-61,+89]mm · 3 of 35 slices shown (2 of 2)]
[im 1/35]
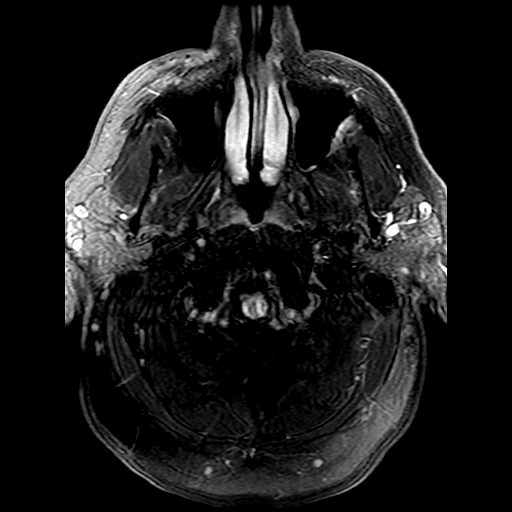
[im 18/35]
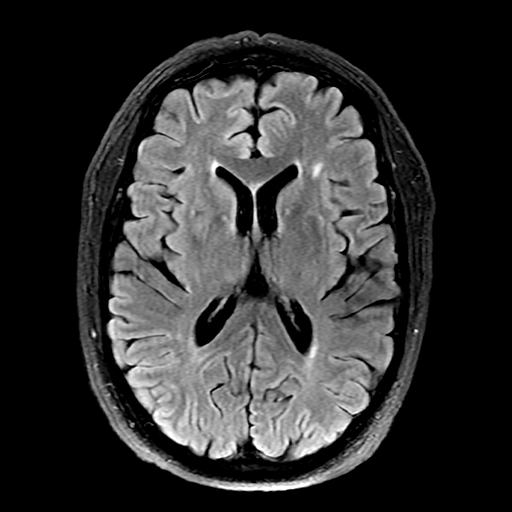
[im 35/35]
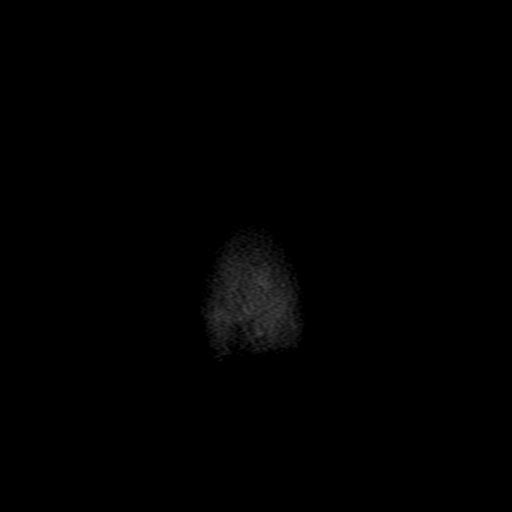

[Series 350: ADC · axial · 3.0mm · 0.94mm/px · z∈[-64,+85]mm · 4 of 51 slices shown (1 of 2)]
[im 1/51]
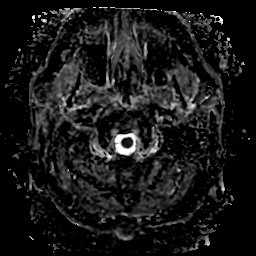
[im 17/51]
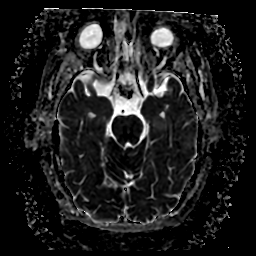
[im 34/51]
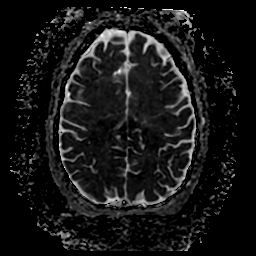
[im 51/51]
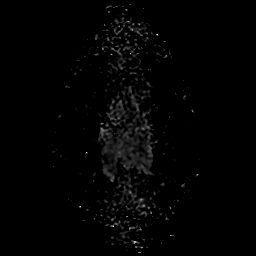

[Series 450: ADC · coronal · 4.0mm · 0.94mm/px · 3 of 38 slices shown (2 of 2)]
[im 1/38]
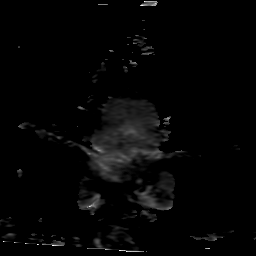
[im 19/38]
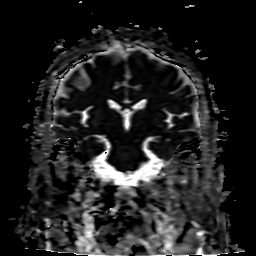
[im 38/38]
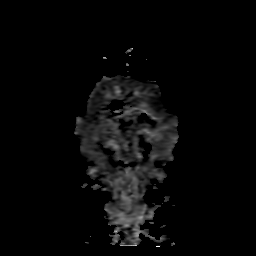

[25 of 48 positions shown; findings below may reference images not displayed]

FINDINGS: Brain: No acute infarct, mass effect or extra-axial collection. No
acute or chronic hemorrhage. There is multifocal hyperintense
T2-weighted signal within the white matter. Parenchymal volume and
CSF spaces are normal. The midline structures are normal. Old right
cerebellar infarct.

Vascular: Major flow voids are preserved.

Skull and upper cervical spine: Normal calvarium and skull base.
Visualized upper cervical spine and soft tissues are normal.

Sinuses/Orbits:No paranasal sinus fluid levels or advanced mucosal
thickening. No mastoid or middle ear effusion. Normal orbits.
IMPRESSION: 1. No acute intracranial abnormality.
2. Old right cerebellar infarct and findings of chronic
microvascular disease.

## 2021-01-16 MED ORDER — DIPHENHYDRAMINE HCL 50 MG/ML IJ SOLN
12.5000 mg | Freq: Once | INTRAMUSCULAR | Status: AC
Start: 2021-01-16 — End: 2021-01-16
  Administered 2021-01-16: 12.5 mg via INTRAVENOUS
  Filled 2021-01-16: qty 1

## 2021-01-16 MED ORDER — METOCLOPRAMIDE HCL 5 MG/ML IJ SOLN
10.0000 mg | Freq: Once | INTRAMUSCULAR | Status: AC
  Administered 2021-01-16: 10 mg via INTRAVENOUS
  Filled 2021-01-16: qty 2

## 2021-01-16 MED ORDER — LORAZEPAM 2 MG/ML IJ SOLN
0.5000 mg | Freq: Once | INTRAMUSCULAR | Status: AC
  Administered 2021-01-16: 0.5 mg via INTRAVENOUS
  Filled 2021-01-16: qty 1

## 2021-01-16 NOTE — ED Triage Notes (Signed)
Pt here for headache that started last night on R side of head that worsens w/ movement, endorses pressure behind eyes, and intermittent blurry vision and numbness in L hand. Pt states he checked his BP at [employer] on Wednesday and it was high, does not take BP meds.

## 2021-01-16 NOTE — ED Provider Notes (Signed)
Emergency Medicine Provider Triage Evaluation Note  Allen Cooke , a 60 y.o. male  was evaluated in triage.  Pt complains of headache that started yesterday evening seems to be right-sided achy seems to be somewhat exertional worse with movement.  Review of Systems  Positive: Headache, left arm paresthesias, blurry vision Negative: Fever  Physical Exam  BP (!) 168/118 (BP Location: Right Arm)   Pulse 84   Temp 98.1 F (36.7 C) (Oral)   Resp 18   Ht 5\' 10"  (1.778 m)   Wt 106.6 kg   SpO2 94%   BMI 33.72 kg/m  Gen:   Awake, no distress   Resp:  Normal effort  MSK:   Moves extremities without difficulty  Other:   Alert and oriented to self, place, time and event.   Speech is fluent, clear without dysarthria or dysphasia.   Strength 5/5 in upper/lower extremities   Sensation intact in upper/lower extremities   No pronator drift.  Normal finger-to-nose  CN I not tested  CN II grossly intact visual fields bilaterally. Did not visualize posterior eye.  CN III, IV, VI PERRLA and EOMs intact bilaterally  CN V Intact sensation to sharp and light touch to the face  CN VII facial movements symmetric  CN VIII not tested  CN IX, X no uvula deviation, symmetric rise of soft palate  CN XI 5/5 SCM and trapezius strength bilaterally  CN XII Midline tongue protrusion, symmetric L/R movements    Medical Decision Making  Medically screening exam initiated at 9:03 PM.  Appropriate orders placed.  Allen Cooke was informed that the remainder of the evaluation will be completed by another provider, this initial triage assessment does not replace that evaluation, and the importance of remaining in the ED until their evaluation is complete.  Patient neurologically intact on my examination.  Has intact finger-nose good sensation in all 4 extremities.  Seems to be having paresthesias rather than frank numbness.  Does have some blurry vision he states but visual fields are intact  grossly my examination.  Discussed with charge RN the patient would be benefited by expedient placement in major care    Ulla Gallo, Gailen Shelter 01/16/21 2112    2113, MD 01/16/21 2258

## 2021-01-16 NOTE — ED Provider Notes (Signed)
[facility] EMERGENCY DEPARTMENT Provider Note   CSN: [id number] Arrival date & time: 01/16/21  2015     History Chief Complaint  Patient presents with   Headache   Blurred Vision    Allen Cooke is a 60 y.o. male.  Patient to ED for evaluation of headache that started last night (8/18), described as sharp, throbbing, right sided. There is mild photophobia and quick head movement tends to excite the pain. No dizziness, lightheadedness, nausea, vomiting. He has blurred vision like things in focus go out of focus. No facial droop noticed. No difficulty with speech and no gait disturbances. He reports also that he woke last night and felt his left arm was 'tingly'. Denies feeling weak on one side or the other. He states he has had similar headaches in the past, not associated with left arm symptoms, but what made him come tonight for evaluation was an elevated blood pressure when checked at a pharmacy tonight. No recent illness or fever. No cough, SOB/DOE, chest pain. He denies any medical history, is not on medications, is a nonsmoker.   The history is provided by the patient. No language interpreter was used.  Headache Associated symptoms: numbness (Left UE) and photophobia   Associated symptoms: no abdominal pain, no congestion, no cough, no dizziness, no fever, no nausea, no neck pain, no neck stiffness, no vomiting and no weakness       Past Medical History:  Diagnosis Date   Anxiety     Patient Active Problem List   Diagnosis Date Noted   Strain of right Achilles tendon 04/12/2017   Acute pain of right shoulder 04/12/2017    Past Surgical History:  Procedure Laterality Date   ANTERIOR CRUCIATE LIGAMENT REPAIR Right 1985       History reviewed. No pertinent family history.  Social History   Tobacco Use   Smoking status: Never   Smokeless tobacco: Never  Substance Use Topics   Alcohol use: Yes    Comment: occ   Drug use: No    Home  Medications Prior to Admission medications   Medication Sig Start Date End Date Taking? Authorizing Provider  HYDROcodone-acetaminophen (NORCO/VICODIN) 5-325 MG tablet Take 1 tablet every 6 (six) hours as needed by mouth for severe pain. 04/09/17   Caccavale, Sophia, PA-C  ibuprofen (ADVIL,MOTRIN) 600 MG tablet Take 1 tablet (600 mg total) 3 (three) times daily with meals by mouth. 04/09/17   Caccavale, Sophia, PA-C  naproxen (NAPROSYN) 500 MG tablet Take 1 tablet (500 mg total) 2 (two) times daily with a meal by mouth. 04/12/17   Tarry Kos, MD    Allergies    Patient has no known allergies.  Review of Systems   Review of Systems  Constitutional:  Negative for chills and fever.  HENT: Negative.  Negative for congestion, facial swelling and sinus pain.   Eyes:  Positive for photophobia and visual disturbance.  Respiratory: Negative.  Negative for cough and shortness of breath.   Cardiovascular: Negative.  Negative for chest pain, palpitations and leg swelling.  Gastrointestinal: Negative.  Negative for abdominal pain, nausea and vomiting.  Musculoskeletal: Negative.  Negative for gait problem, neck pain and neck stiffness.  Skin: Negative.  Negative for color change.  Neurological:  Positive for numbness (Left UE) and headaches. Negative for dizziness, weakness and light-headedness.  Psychiatric/Behavioral:  Negative for confusion.    Physical Exam Updated Vital Signs BP (!) 180/102 (BP Location: Left Arm)   Pulse 74  Temp 98.1 F (36.7 C) (Oral)   Resp 18   Ht 5\' 10"  (1.778 m)   Wt 106.6 kg   SpO2 95%   BMI 33.72 kg/m   Physical Exam Vitals and nursing note reviewed.  Constitutional:      General: He is not in acute distress.    Appearance: He is well-developed. He is obese.  HENT:     Head: Normocephalic.  Eyes:     General: No visual field deficit.    Extraocular Movements: Extraocular movements intact.     Pupils: Pupils are equal, round, and reactive to light.   Neck:     Comments: No carotid bruit Cardiovascular:     Rate and Rhythm: Normal rate and regular rhythm.     Heart sounds: No murmur heard. Pulmonary:     Effort: Pulmonary effort is normal.     Breath sounds: Normal breath sounds. No wheezing, rhonchi or rales.  Abdominal:     General: Bowel sounds are normal.     Palpations: Abdomen is soft.     Tenderness: There is no abdominal tenderness. There is no guarding or rebound.  Musculoskeletal:        General: Normal range of motion.     Cervical back: Normal range of motion and neck supple.  Skin:    General: Skin is warm and dry.  Neurological:     General: No focal deficit present.     Mental Status: He is alert and oriented to person, place, and time.     GCS: GCS eye subscore is 4. GCS verbal subscore is 5. GCS motor subscore is 6.     Cranial Nerves: No dysarthria or facial asymmetry.     Sensory: No sensory deficit.     Motor: No weakness.     Coordination: Romberg sign negative. Coordination normal.     Gait: Gait normal.  Psychiatric:        Mood and Affect: Mood normal.    ED Results / Procedures / Treatments   Labs (all labs ordered are listed, but only abnormal results are displayed) Labs Reviewed  I-STAT CHEM 8, ED - Abnormal; Notable for the following components:      Result Value   Glucose, Bld 120 (*)    All other components within normal limits  CBC WITH DIFFERENTIAL/PLATELET  COMPREHENSIVE METABOLIC PANEL  PROTIME-INR    EKG None  Radiology No results found.  Procedures Procedures   Medications Ordered in ED Medications - No data to display  ED Course  I have reviewed the triage vital signs and the nursing notes.  Pertinent labs & imaging results that were available during my care of the patient were reviewed by me and considered in my medical decision making (see chart for details).    MDM Rules/Calculators/A&P                           Patient to ED for evaluation of headache as  further detailed in the HPI. History of similar headaches, today found in the setting of elevated blood pressure.   Neuro exam reassuring. No deficits noted. Migraine cocktail provided. Will reassess.  Discussed with neurology, dr. , who recommends CT head and MRI brain given presentation. These studies are normal - no evidence of stroke or bleed.   On re-eval, he is feeling better. BP improving. Headache is predominantly resolved. Will add toradol and reassess. Anticipate d/ch home.   HA completely  resolved after toradol. Patient considered appropriate for discharge home.   Final Clinical Impression(s) / ED Diagnoses Final diagnoses:  None   Headache Paresthesia  Rx / DC Orders ED Discharge Orders     None        Elpidio Anis, PA-C 01/17/21 0128    Franne Forts, DO 01/17/21 2137

## 2021-01-16 NOTE — ED Notes (Signed)
Warm blanket placed over pt °

## 2021-01-16 NOTE — ED Notes (Signed)
Patient transported to CT 

## 2021-01-17 MED ORDER — KETOROLAC TROMETHAMINE 30 MG/ML IJ SOLN
15.0000 mg | Freq: Once | INTRAMUSCULAR | Status: AC
  Administered 2021-01-17: 15 mg via INTRAVENOUS
  Filled 2021-01-17: qty 1

## 2021-01-17 NOTE — Discharge Instructions (Addendum)
Follow up with neurology for further evaluation of headache pain. You can call [facility] clinic for routine primary care concerns and recheck of your blood pressure that was elevated today.   Return to the eD with any new or worsening symptoms.

## 2021-01-17 NOTE — ED Notes (Signed)
[employer] called for pt 

## 2021-01-17 NOTE — ED Notes (Signed)
E-signature pad unavailable at time of pt discharge. This RN discussed discharge materials with pt and answered all pt questions. Pt stated understanding of discharge material. ? ?

## 2021-01-19 ENCOUNTER — Encounter: Payer: Self-pay | Admitting: Neurology

## 2021-02-11 NOTE — Progress Notes (Signed)
NEUROLOGY CONSULTATION NOTE  Allen Cooke MRN: [id number] DOB: 1960-10-13  Referring provider: Edwin Dada, DO (ED referral) Primary care provider: No PCP  Reason for consult:  headache  Assessment/Plan:   Worsening headaches over past year - may be migraine but given age and change in headache, would rule out secondary etiologies Family history of cerebral aneurysm Elevated blood pressure - patient not on antihypertensive and reports high blood pressure over past couple of years.  Check CTA of head and neck Check sed rate and CRP Start topiramate 50mg  at bedtime - we can increase to 100mg  at bedtime in 6 weeks if needed Limit use of pain relievers to no more than 2 days out of week to prevent risk of rebound or medication-overuse headache. Keep headache diary Follow up with PCP regarding blood pressure Follow up with me in 6 months.    Subjective:  Allen Cooke is a 60 year old right-handed male who presents for headaches.  History supplemented by ED note.   He has history of headaches for several years but they were very infrequent and not severe.  Over the last couple of years, they have become mor frequent and severe.  He describes a 9/10 non-throbbing/squeezing pain in the right parietal/retro-orbital/vertex region with photophobia, mild nausea, both eyes water.  No associated fever, nuchal rigidity, vomiting, phonophobia, osmophobia, nasal congestion/rhinorrhea, ptosis, conjunctival injection, visual disturbance, numbness or weakness.  Quick head movement aggravates it.  They last several hours until he falls asleep and they occur 2 times a week.  He treats with either ibuprofen, ASA or acetaminophen.  He reports increased stress over the past year which may be a trigger for increased headaches.  On the evening of 01/15/2021, he woke up with one of these headaches with tingling of his left arm but no weakness.  The next day, he had elevated blood pressure when he  checked it at the pharmacy.  He went to the ED.  Blood pressure was 180/102.  CT and MRI of brain personally reviewed were unremarkable for acute intracranial abnormality.  Treated for headache which resolved.     Other history:  he had meningitis at age 65 Family history:  Mother passed away due to ruptured cerebral aneurysm  PAST MEDICAL HISTORY: Past Medical History:  Diagnosis Date   Anxiety     PAST SURGICAL HISTORY: Past Surgical History:  Procedure Laterality Date   ANTERIOR CRUCIATE LIGAMENT REPAIR Right 1985    MEDICATIONS: Current Outpatient Medications on File Prior to Visit  Medication Sig Dispense Refill   HYDROcodone-acetaminophen (NORCO/VICODIN) 5-325 MG tablet Take 1 tablet every 6 (six) hours as needed by mouth for severe pain. 5 tablet 0   ibuprofen (ADVIL,MOTRIN) 600 MG tablet Take 1 tablet (600 mg total) 3 (three) times daily with meals by mouth. 30 tablet 0   naproxen (NAPROSYN) 500 MG tablet Take 1 tablet (500 mg total) 2 (two) times daily with a meal by mouth. 30 tablet 3   No current facility-administered medications on file prior to visit.    ALLERGIES: No Known Allergies  FAMILY HISTORY: No family history on file.  Objective:  Blood pressure (!) 174/93, pulse 82, height 5\' 10"  (1.778 m), weight 248 lb 12.8 oz (112.9 kg), SpO2 95 %. General: No acute distress.  Patient appears well-groomed.   Head:  Normocephalic/atraumatic Eyes:  fundi examined but not visualized Neck: supple, no paraspinal tenderness, full range of motion Back: No paraspinal tenderness Heart: regular rate and rhythm Lungs:  Clear to auscultation bilaterally. Vascular: No carotid bruits. Neurological Exam: Mental status: alert and oriented to person, place, and time, recent and remote memory intact, fund of knowledge intact, attention and concentration intact, speech fluent and not dysarthric, language intact. Cranial nerves: CN I: not tested CN II: pupils equal, round and  reactive to light, visual fields intact CN III, IV, VI:  full range of motion, no nystagmus, no ptosis CN V: facial sensation intact. CN VII: upper and lower face symmetric CN VIII: hearing intact CN IX, X: gag intact, uvula midline CN XI: sternocleidomastoid and trapezius muscles intact CN XII: tongue midline Bulk & Tone: normal, no fasciculations. Motor:  muscle strength 5/5 throughout Sensation:  Pinprick, temperature and vibratory sensation intact. Deep Tendon Reflexes:  2+ throughout,  toes downgoing.   Finger to nose testing:  Without dysmetria.   Heel to shin:  Without dysmetria.   Gait:  Normal station and stride.  Romberg negative.    Thank you for allowing me to take part in the care of this patient.  Shon Millet, DO  CC: Ricky Stabs, NP

## 2021-02-12 ENCOUNTER — Other Ambulatory Visit: Payer: Self-pay

## 2021-02-12 ENCOUNTER — Ambulatory Visit (INDEPENDENT_AMBULATORY_CARE_PROVIDER_SITE_OTHER): Payer: Self-pay | Admitting: Neurology

## 2021-02-12 ENCOUNTER — Encounter: Payer: Self-pay | Admitting: Neurology

## 2021-02-12 VITALS — BP 174/93 | HR 82 | Ht 70.0 in | Wt 248.8 lb

## 2021-02-12 DIAGNOSIS — I10 Essential (primary) hypertension: Secondary | ICD-10-CM

## 2021-02-12 DIAGNOSIS — I6529 Occlusion and stenosis of unspecified carotid artery: Secondary | ICD-10-CM

## 2021-02-12 DIAGNOSIS — Z8249 Family history of ischemic heart disease and other diseases of the circulatory system: Secondary | ICD-10-CM

## 2021-02-12 DIAGNOSIS — R519 Headache, unspecified: Secondary | ICD-10-CM

## 2021-02-12 MED ORDER — TOPIRAMATE 50 MG PO TABS: 50.0000 mg | ORAL_TABLET | Freq: Every day | ORAL | 5 refills | Status: DC

## 2021-02-12 NOTE — Patient Instructions (Signed)
  Check CTA of head and neck Check sed rate and CRP Start topiramate 50mg  at bedtime.  Contact in 6 weeks with update and we can increase dose if needed. Limit use of pain relievers to no more than 2 days out of the week.  These medications include acetaminophen, NSAIDs (ibuprofen/Advil/Motrin, naproxen/Aleve, triptans (Imitrex/sumatriptan), Excedrin, and narcotics.  This will help reduce risk of rebound headaches. Be aware of common food triggers:  - Caffeine:  coffee, black tea, cola, Mt. Dew  - Chocolate  - Dairy:  aged cheeses (brie, blue, cheddar, gouda, [place], provolone, [place], Swiss, etc), chocolate milk, buttermilk, sour cream, limit eggs and yogurt  - Nuts, peanut butter  - Alcohol  - Cereals/grains:  FRESH breads (fresh bagels, sourdough, doughnuts), yeast productions  - Processed/canned/aged/cured meats (pre-packaged deli meats, hotdogs)  - MSG/glutamate:  soy sauce, flavor enhancer, pickled/preserved/marinated foods  - Sweeteners:  aspartame (Equal, Nutrasweet).  Sugar and Splenda are okay  - Vegetables:  legumes (lima beans, lentils, snow peas, fava beans, pinto peans, peas, garbanzo beans), sauerkraut, onions, olives, pickles  - Fruit:  avocados, bananas, citrus fruit (orange, lemon, grapefruit), mango  - Other:  Frozen meals, macaroni and cheese Routine exercise Stay adequately hydrated (aim for 64 oz water daily) Keep headache diary Maintain proper stress management Maintain proper sleep hygiene Do not skip meals Consider supplements:  magnesium citrate 400mg  daily, riboflavin 400mg  daily, coenzyme Q10 100mg  three times daily. Follow up with your PCP regarding blood pressure

## 2021-02-13 ENCOUNTER — Encounter: Payer: Self-pay | Admitting: Neurology

## 2021-02-18 ENCOUNTER — Telehealth: Payer: Self-pay | Admitting: Neurology

## 2021-02-18 NOTE — Telephone Encounter (Signed)
1. Which medications need to be refilled? (please list name of each medication and dose if known) Topiramate  2. Which pharmacy/location (including street and city if local pharmacy) is medication to be sent to? Change to new pharmacy of [facility] [address] [place]  3. Do they need a 30 day or 90 day supply?  90 day supply if he can

## 2021-02-19 MED ORDER — TOPIRAMATE 50 MG PO TABS: 50.0000 mg | ORAL_TABLET | Freq: Every day | ORAL | 5 refills | Status: DC

## 2021-02-19 NOTE — Telephone Encounter (Signed)
Reviewed pt  chart it looks like the Script for Topiramate was sent to a pharmacy in [place].  Resent the Topiramate to [facility] pharmacy per pt request.

## 2021-04-03 NOTE — Progress Notes (Deleted)
Subjective:    Allen Cooke - 60 y.o. male MRN [id number]  Date of birth: [birth year]  HPI  Allen Cooke is to establish care.   Current issues and/or concerns: HOSPITAL DISCHARGE FOLLOW-UP: 01/16/2021 - 01/17/2021 at [facility] Emergency Department per DO note: Patient to ED for evaluation of headache as further detailed in the HPI. History of similar headaches, today found in the setting of elevated blood pressure.    Neuro exam reassuring. No deficits noted. Migraine cocktail provided. Will reassess.   Discussed with neurology, dr. Derry Lory, who recommends CT head and MRI brain given presentation. These studies are normal - no evidence of stroke or bleed.    On re-eval, he is feeling better. BP improving. Headache is predominantly resolved. Will add toradol and reassess. Anticipate d/ch home.    HA completely resolved after toradol. Patient considered appropriate for discharge home.   Visit 02/12/2021 at [facility] per DO note: Worsening headaches over past year - may be migraine but given age and change in headache, would rule out secondary etiologies Family history of cerebral aneurysm Elevated blood pressure - patient not on antihypertensive and reports high blood pressure over past couple of years.   Check CTA of head and neck Check sed rate and CRP Start topiramate 50mg  at bedtime - we can increase to 100mg  at bedtime in 6 weeks if needed Limit use of pain relievers to no more than 2 days out of week to prevent risk of rebound or medication-overuse headache. Keep headache diary Follow up with PCP regarding blood pressure Follow up with me in 6 months.   04/06/2021: HYPERTENSION Currently taking: see medication list Have you taken your blood pressure medication today: []  Yes []  No  Med Adherence: []  Yes    []  No Medication side effects: []  Yes    []  No Adherence with salt restriction (low-salt diet): []  Yes    []  No Exercise:  Yes []  No []  Home Monitoring?: []  Yes    []  No Monitoring Frequency: []  Yes    []  No Home BP results range: []  Yes    []  No Smoking []  Yes []  No SOB? []  Yes    []  No Chest Pain?: []  Yes    []  No Leg swelling?: []  Yes    []  No Headaches?: []  Yes    []  No Dizziness? []  Yes    []  No Comments:    ROS per HPI     Health Maintenance:  - *** Health Maintenance Due  Topic Date Due   COVID-19 Vaccine (1) Never done   Pneumococcal Vaccine 30-17 Years old (1 - PCV) Never done   HIV Screening  Never done   Hepatitis C Screening  Never done   COLONOSCOPY (Pts 45-55yrs Insurance coverage will need to be confirmed)  Never done   Zoster Vaccines- Shingrix (1 of 2) Never done   INFLUENZA VACCINE  Never done     Past Medical History: Patient Active Problem List   Diagnosis Date Noted   Strain of right Achilles tendon 04/12/2017   Acute pain of right shoulder 04/12/2017      Social History   reports that he has never smoked. He has never used smokeless tobacco. He reports current alcohol use. He reports that he does not use drugs.   Family History  family history includes Coronary aneurysm in his mother; Migraines in his mother.   Medications: reviewed and updated   Objective:   Physical Exam There  were no vitals taken for this visit. Physical Exam      Assessment & Plan:         Patient was given clear instructions to go to Emergency Department or return to medical center if symptoms don't improve, worsen, or new problems develop.The patient verbalized understanding.  I discussed the assessment and treatment plan with the patient. The patient was provided an opportunity to ask questions and all were answered. The patient agreed with the plan and demonstrated an understanding of the instructions.   The patient was advised to call back or seek an in-person evaluation if the symptoms worsen or if the condition fails to improve as anticipated.    Ricky Stabs,  NP 04/03/2021, 2:28 PM Primary Care at [facility]

## 2021-04-06 ENCOUNTER — Ambulatory Visit: Payer: Self-pay | Admitting: Family

## 2021-07-09 ENCOUNTER — Other Ambulatory Visit (HOSPITAL_COMMUNITY)
Admission: EM | Admit: 2021-07-09 | Discharge: 2021-07-13 | Disposition: A | Discharge status: Rehabilitation Facility | Payer: No Payment, Other | Attending: Psychiatry | Admitting: Psychiatry

## 2021-07-09 DIAGNOSIS — Y903 Blood alcohol level of 60-79 mg/100 ml: Secondary | ICD-10-CM | POA: Insufficient documentation

## 2021-07-09 DIAGNOSIS — F332 Major depressive disorder, recurrent severe without psychotic features: Secondary | ICD-10-CM | POA: Diagnosis present

## 2021-07-09 DIAGNOSIS — N401 Enlarged prostate with lower urinary tract symptoms: Secondary | ICD-10-CM | POA: Insufficient documentation

## 2021-07-09 DIAGNOSIS — F515 Nightmare disorder: Secondary | ICD-10-CM | POA: Insufficient documentation

## 2021-07-09 DIAGNOSIS — F1290 Cannabis use, unspecified, uncomplicated: Secondary | ICD-10-CM | POA: Insufficient documentation

## 2021-07-09 DIAGNOSIS — R110 Nausea: Secondary | ICD-10-CM | POA: Insufficient documentation

## 2021-07-09 DIAGNOSIS — R350 Frequency of micturition: Secondary | ICD-10-CM | POA: Insufficient documentation

## 2021-07-09 DIAGNOSIS — R61 Generalized hyperhidrosis: Secondary | ICD-10-CM | POA: Insufficient documentation

## 2021-07-09 DIAGNOSIS — G4700 Insomnia, unspecified: Secondary | ICD-10-CM | POA: Insufficient documentation

## 2021-07-09 DIAGNOSIS — R450 Nervousness: Secondary | ICD-10-CM | POA: Insufficient documentation

## 2021-07-09 DIAGNOSIS — Z5900 Homelessness unspecified: Secondary | ICD-10-CM | POA: Insufficient documentation

## 2021-07-09 DIAGNOSIS — R4587 Impulsiveness: Secondary | ICD-10-CM | POA: Insufficient documentation

## 2021-07-09 DIAGNOSIS — Z602 Problems related to living alone: Secondary | ICD-10-CM | POA: Insufficient documentation

## 2021-07-09 DIAGNOSIS — Z79899 Other long term (current) drug therapy: Secondary | ICD-10-CM | POA: Insufficient documentation

## 2021-07-09 DIAGNOSIS — M25561 Pain in right knee: Secondary | ICD-10-CM | POA: Insufficient documentation

## 2021-07-09 DIAGNOSIS — Z20822 Contact with and (suspected) exposure to covid-19: Secondary | ICD-10-CM | POA: Insufficient documentation

## 2021-07-09 DIAGNOSIS — R251 Tremor, unspecified: Secondary | ICD-10-CM | POA: Insufficient documentation

## 2021-07-09 DIAGNOSIS — Z560 Unemployment, unspecified: Secondary | ICD-10-CM | POA: Insufficient documentation

## 2021-07-09 DIAGNOSIS — F419 Anxiety disorder, unspecified: Secondary | ICD-10-CM | POA: Insufficient documentation

## 2021-07-09 DIAGNOSIS — G8929 Other chronic pain: Secondary | ICD-10-CM | POA: Insufficient documentation

## 2021-07-09 DIAGNOSIS — Z5986 Financial insecurity: Secondary | ICD-10-CM | POA: Insufficient documentation

## 2021-07-09 DIAGNOSIS — Z599 Problem related to housing and economic circumstances, unspecified: Secondary | ICD-10-CM | POA: Insufficient documentation

## 2021-07-09 DIAGNOSIS — I10 Essential (primary) hypertension: Secondary | ICD-10-CM | POA: Insufficient documentation

## 2021-07-09 DIAGNOSIS — F1020 Alcohol dependence, uncomplicated: Secondary | ICD-10-CM | POA: Insufficient documentation

## 2021-07-09 DIAGNOSIS — F1511 Other stimulant abuse, in remission: Secondary | ICD-10-CM | POA: Insufficient documentation

## 2021-07-09 NOTE — Progress Notes (Signed)
Pt is urgent.  Has been drinking a 5th or a pint of vodka daily for years.  He is homeless and was referred by [facility] and [facility].  Pt denies any SI, HI or A/V hallucinations.  He wants to get into a 30 day progrqam at [facility].
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
## 2021-07-10 ENCOUNTER — Other Ambulatory Visit: Payer: Self-pay

## 2021-07-10 ENCOUNTER — Encounter (HOSPITAL_COMMUNITY): Payer: Self-pay | Admitting: Student

## 2021-07-10 DIAGNOSIS — F332 Major depressive disorder, recurrent severe without psychotic features: Secondary | ICD-10-CM | POA: Diagnosis present

## 2021-07-10 LAB — COMPREHENSIVE METABOLIC PANEL
ALT: 56 U/L — ABNORMAL HIGH (ref 0–44)
AST: 29 U/L (ref 15–41)
Albumin: 4.1 g/dL (ref 3.5–5.0)
Alkaline Phosphatase: 50 U/L (ref 38–126)
Anion gap: 14 (ref 5–15)
BUN: 20 mg/dL (ref 8–23)
CO2: 21 mmol/L — ABNORMAL LOW (ref 22–32)
Calcium: 9.3 mg/dL (ref 8.9–10.3)
Chloride: 101 mmol/L (ref 98–111)
Creatinine, Ser: 0.88 mg/dL (ref 0.61–1.24)
GFR, Estimated: >60 mL/min (ref >60)
Glucose, Bld: 107 mg/dL — ABNORMAL HIGH (ref 70–99)
Potassium: 3.7 mmol/L (ref 3.5–5.1)
Sodium: 136 mmol/L (ref 135–145)
Total Bilirubin: 0.9 mg/dL (ref 0.3–1.2)
Total Protein: 6.8 g/dL (ref 6.5–8.1)

## 2021-07-10 LAB — CBC WITH DIFFERENTIAL/PLATELET
Abs Immature Granulocytes: 0.02 10*3/uL (ref 0.00–0.07)
Basophils Absolute: 0.1 10*3/uL (ref 0.0–0.1)
Basophils Relative: 1 %
Eosinophils Absolute: 0.2 10*3/uL (ref 0.0–0.5)
Eosinophils Relative: 3 %
HCT: 46.3 % (ref 39.0–52.0)
Hemoglobin: 15.7 g/dL (ref 13.0–17.0)
Immature Granulocytes: 0 %
Lymphocytes Relative: 35 %
Lymphs Abs: 1.9 10*3/uL (ref 0.7–4.0)
MCH: 30.3 pg (ref 26.0–34.0)
MCHC: 33.9 g/dL (ref 30.0–36.0)
MCV: 89.4 fL (ref 80.0–100.0)
Monocytes Absolute: 0.4 10*3/uL (ref 0.1–1.0)
Monocytes Relative: 7 %
Neutro Abs: 2.9 10*3/uL (ref 1.7–7.7)
Neutrophils Relative %: 54 %
Platelets: 222 10*3/uL (ref 150–400)
RBC: 5.18 MIL/uL (ref 4.22–5.81)
RDW: 14.1 % (ref 11.5–15.5)
WBC: 5.4 10*3/uL (ref 4.0–10.5)
nRBC: 0 % (ref 0.0–0.2)

## 2021-07-10 LAB — LIPID PANEL
Cholesterol: 215 mg/dL — ABNORMAL HIGH (ref 0–200)
HDL: 56 mg/dL (ref >40)
LDL Cholesterol: 126 mg/dL — ABNORMAL HIGH (ref 0–99)
Total CHOL/HDL Ratio: 3.8 RATIO
Triglycerides: 167 mg/dL — ABNORMAL HIGH (ref <150)
VLDL: 33 mg/dL (ref 0–40)

## 2021-07-10 LAB — POCT URINE DRUG SCREEN - MANUAL ENTRY (I-SCREEN)
POC Amphetamine UR: NOT DETECTED
POC Buprenorphine (BUP): NOT DETECTED
POC Cocaine UR: NOT DETECTED
POC Marijuana UR: POSITIVE — AB
POC Methadone UR: NOT DETECTED
POC Methamphetamine UR: NOT DETECTED
POC Morphine: NOT DETECTED
POC Oxazepam (BZO): NOT DETECTED
POC Oxycodone UR: NOT DETECTED
POC Secobarbital (BAR): NOT DETECTED

## 2021-07-10 LAB — RESP PANEL BY RT-PCR (FLU A&B, COVID) ARPGX2
Influenza A by PCR: NEGATIVE
Influenza B by PCR: NEGATIVE
SARS Coronavirus 2 by RT PCR: NEGATIVE

## 2021-07-10 LAB — TSH: TSH: 1.962 u[IU]/mL (ref 0.350–4.500)

## 2021-07-10 LAB — HEMOGLOBIN A1C
Hgb A1c MFr Bld: 5.4 % (ref 4.8–5.6)
Mean Plasma Glucose: 108.28 mg/dL

## 2021-07-10 LAB — POC SARS CORONAVIRUS 2 AG -  ED: SARS Coronavirus 2 Ag: NEGATIVE

## 2021-07-10 LAB — ETHANOL: Alcohol, Ethyl (B): 66 mg/dL — ABNORMAL HIGH (ref <10)

## 2021-07-10 MED ORDER — AMLODIPINE BESYLATE 10 MG PO TABS
10.0000 mg | ORAL_TABLET | Freq: Every day | ORAL | Status: DC
  Administered 2021-07-10 – 2021-07-12 (×3): 10 mg via ORAL
  Filled 2021-07-10 (×3): qty 1
  Filled 2021-07-10: qty 14

## 2021-07-10 MED ORDER — LOPERAMIDE HCL 2 MG PO CAPS: 2.0000 mg | ORAL_CAPSULE | ORAL | Status: AC | PRN

## 2021-07-10 MED ORDER — TAMSULOSIN HCL 0.4 MG PO CAPS
0.4000 mg | ORAL_CAPSULE | Freq: Every day | ORAL | Status: DC
Start: 2021-07-10 — End: 2021-07-13
  Administered 2021-07-10 – 2021-07-12 (×3): 0.4 mg via ORAL
  Filled 2021-07-10: qty 14
  Filled 2021-07-10 (×3): qty 1

## 2021-07-10 MED ORDER — MAGNESIUM HYDROXIDE 400 MG/5ML PO SUSP: 30.0000 mL | Freq: Every day | ORAL | Status: DC | PRN

## 2021-07-10 MED ORDER — LORAZEPAM 1 MG PO TABS
1.0000 mg | ORAL_TABLET | Freq: Four times a day (QID) | ORAL | Status: AC
  Administered 2021-07-10 (×4): 1 mg via ORAL
  Filled 2021-07-10 (×4): qty 1

## 2021-07-10 MED ORDER — LORAZEPAM 1 MG PO TABS
1.0000 mg | ORAL_TABLET | Freq: Three times a day (TID) | ORAL | Status: AC
  Administered 2021-07-11 (×2): 1 mg via ORAL
  Filled 2021-07-10 (×3): qty 1

## 2021-07-10 MED ORDER — MELOXICAM 7.5 MG PO TABS
15.0000 mg | ORAL_TABLET | Freq: Every day | ORAL | Status: DC | PRN
  Administered 2021-07-10: 15 mg via ORAL
  Filled 2021-07-10: qty 2
  Filled 2021-07-10: qty 28

## 2021-07-10 MED ORDER — LORAZEPAM 1 MG PO TABS
1.0000 mg | ORAL_TABLET | Freq: Four times a day (QID) | ORAL | Status: AC | PRN
  Administered 2021-07-11: 1 mg via ORAL

## 2021-07-10 MED ORDER — ADULT MULTIVITAMIN W/MINERALS CH
1.0000 | ORAL_TABLET | Freq: Every day | ORAL | Status: DC
  Administered 2021-07-10 – 2021-07-12 (×3): 1 via ORAL
  Filled 2021-07-10 (×3): qty 1

## 2021-07-10 MED ORDER — LORAZEPAM 1 MG PO TABS
1.0000 mg | ORAL_TABLET | Freq: Two times a day (BID) | ORAL | Status: AC
  Administered 2021-07-12 (×2): 1 mg via ORAL
  Filled 2021-07-10 (×2): qty 1

## 2021-07-10 MED ORDER — MELOXICAM 7.5 MG PO TABS: 15.0000 mg | ORAL_TABLET | Freq: Every day | ORAL | Status: DC | PRN

## 2021-07-10 MED ORDER — HYDROXYZINE HCL 25 MG PO TABS
25.0000 mg | ORAL_TABLET | Freq: Four times a day (QID) | ORAL | Status: AC | PRN
  Filled 2021-07-10 (×2): qty 20

## 2021-07-10 MED ORDER — HYDROCHLOROTHIAZIDE 25 MG PO TABS
25.0000 mg | ORAL_TABLET | Freq: Every day | ORAL | Status: DC
  Administered 2021-07-10 – 2021-07-12 (×3): 25 mg via ORAL
  Filled 2021-07-10 (×2): qty 1
  Filled 2021-07-10: qty 14
  Filled 2021-07-10: qty 1

## 2021-07-10 MED ORDER — LORAZEPAM 1 MG PO TABS
1.0000 mg | ORAL_TABLET | Freq: Once | ORAL | Status: AC
  Administered 2021-07-10: 1 mg via ORAL
  Filled 2021-07-10: qty 1

## 2021-07-10 MED ORDER — CITALOPRAM HYDROBROMIDE 20 MG PO TABS
20.0000 mg | ORAL_TABLET | Freq: Every day | ORAL | Status: DC
  Administered 2021-07-10 – 2021-07-12 (×3): 20 mg via ORAL
  Filled 2021-07-10 (×3): qty 1
  Filled 2021-07-10: qty 14

## 2021-07-10 MED ORDER — THIAMINE HCL 100 MG PO TABS
100.0000 mg | ORAL_TABLET | Freq: Every day | ORAL | Status: DC
  Administered 2021-07-11 – 2021-07-12 (×2): 100 mg via ORAL
  Filled 2021-07-10 (×2): qty 1

## 2021-07-10 MED ORDER — ACETAMINOPHEN 325 MG PO TABS: 650.0000 mg | ORAL_TABLET | Freq: Four times a day (QID) | ORAL | Status: DC | PRN

## 2021-07-10 MED ORDER — TRAZODONE HCL 50 MG PO TABS
50.0000 mg | ORAL_TABLET | Freq: Every evening | ORAL | Status: DC | PRN
  Administered 2021-07-11 – 2021-07-12 (×2): 50 mg via ORAL
  Filled 2021-07-10: qty 1
  Filled 2021-07-10: qty 14
  Filled 2021-07-10: qty 1

## 2021-07-10 MED ORDER — ALUM & MAG HYDROXIDE-SIMETH 200-200-20 MG/5ML PO SUSP: 30.0000 mL | ORAL | Status: DC | PRN

## 2021-07-10 MED ORDER — ONDANSETRON 4 MG PO TBDP: 4.0000 mg | ORAL_TABLET | Freq: Four times a day (QID) | ORAL | Status: AC | PRN

## 2021-07-10 MED ORDER — LORAZEPAM 1 MG PO TABS: 1.0000 mg | ORAL_TABLET | Freq: Every day | ORAL | Status: DC

## 2021-07-10 NOTE — Progress Notes (Signed)
Pt is asleep. Respirations are even and unlabored. No signs of acute distress noted. Staff will monitor for pt's safety. 

## 2021-07-10 NOTE — Progress Notes (Signed)
Pt is asleep. Respirations are even and unlabored. No distress noted. Staff will monitor for pt's safety. 

## 2021-07-10 NOTE — ED Notes (Signed)
Pt  Pt received with eyes closed appearing to be asleep . Easily aroused and voicing no complaints at this time , does state , Im  tired and haven't  been sleeping well lately and have been using alcohol ", Pt is oriented x3 , thought process is organized  and responds to questions asked, pleasant and cooperative , denies SI / HI , will continue to monitor for safety .

## 2021-07-10 NOTE — ED Notes (Signed)
Appears to be resting quietly at this time , respirations even and unlabored , no distress noted . Will continue to monitor for safety .

## 2021-07-10 NOTE — Progress Notes (Signed)
Pt is awake, alert and oriented. Pt did not voice any complaints of pain or discomfort. No signs of acute distress noted. Administered scheduled meds with no incident. Pt denies current SI/HI/AVH. Staff will monitor for pt's safety. 

## 2021-07-10 NOTE — ED Notes (Signed)
In group room participating in group facilitated by chaplin.  Breathing is even and unlabored.  Will continue to monitor for safety. °

## 2021-07-10 NOTE — ED Notes (Signed)
Pt has been brought on the unit and familiarized with unit. Pt is in bed sleeping, respirations are even/unlabored, will continue to monitor patient for safety

## 2021-07-10 NOTE — ED Provider Notes (Addendum)
Behavioral Health Admission H&P [facility] & OBS)  Date: 07/10/21 Patient Name: Allen Cooke MRN: [id number] Chief Complaint:  Chief Complaint  Patient presents with   Addiction Problem   Depression      Diagnoses:  Final diagnoses:  MDD (major depressive disorder), recurrent severe, without psychosis ([place])  Alcohol use disorder, severe, dependence ([place])    HPI: Allen Cooke is a 61 year old male with reported past psychiatric history significant for depression and past medical history significant for history of hypertension, strain of right Achilles tendon, and chronic right knee pain, who presents to the [facility] behavioral health urgent care [facility]) unaccompanied as a voluntary walk-in for symptoms of worsening depression and substance abuse (currently alcohol abuse).  Patient states that he has a PCP at [facility] who referred him to day [facility].  Patient states that he called day [facility] earlier today on 07/09/2021 to inquire about potential admission to this facility. He reports that he was told by day [facility] staff via phone earlier today that he would be able to be admitted to day [facility] in the near future, but that he would need to come to the [facility] for detox first before he would be able to eventually go to day [place].  Patient also states that he came to the [facility] because "I'm hoping to get into an inpatient program where I can try to get more stable and develop some tools for my substance abuse.  I don't want to continue drinking and don't want to go back to substance abuse".  Patient reports that prior to 05/30/2021, he had been drinking 1/5th of vodka daily for the past 10 years.  Patient reports that after 05/30/2021, he decreased his alcohol consumption down to 1/2 pint of vodka and 3-4 beers daily and reports that he has been drinking at this rate daily since then.  Patient reports that over the past 24 hours, he has consumed 1 pint of vodka, in which patient states that he began drinking  this around 7:00 AM today on 07/09/2021 and last drank around 7:00 PM today.  Patient endorses history of the following alcohol-related withdrawal symptoms: Anxiety, tremor, diaphoresis, nausea. He endorses being slightly anxious and having slight tremor currently on exam, but he denies experiencing any of the additional above withdrawal symptoms or any additional physical symptoms currently on exam.  He denies history of vomiting during alcohol withdrawal or history of any additional alcohol related withdrawal symptoms.  He denies history of seizures.  Patient denies tobacco/nicotine use.  Patient denies tobacco/nicotine use.  He states that prior to 05/30/2021, he had been smoking and snorting methamphetamine daily for greater than or equal to 4 years (patient does not provide details regarding quantity of meth use at that time).  He denies any meth use since 05/30/2021.  Patient endorses using marijuana socially a few times per month.  He reports his last marijuana use was 3 weeks ago.  He denies any additional substance use.  Patient reports that he has never been to rehab or received substance abuse treatment before.  Patient denies SI currently on exam.  He denies experiencing any SI recently over the past 6 months.  Patient denies history of any previous suicide attempts.  He denies history of self-injurious behavior via intentionally cutting or burning himself.  He denies HI.  He denies AVH or paranoia.  He describes his sleep as poor, about 4 to 5 hours per night.  He endorses issues with intermittently waking up throughout the  night.  Patient also reports that since January 2023, he has been having "really bad nightmares" 1-2 times per week that consists of nightmares regarding people that he used to spend time with.  Patient endorses anhedonia as well as feelings of guilt, hopelessness, and worthlessness over the past few months.  He endorses decline in energy over the past 2 years.  Patient also endorses  intermittent declines in concentration and focus over the past few months.  Patient reports that his appetite fluctuates between being increased and decreased over the past few months.  He denies any weight changes over the past few months.  Patient reports that he was recently initiated on Celexa 20 mg p.o. daily by his PCP at [facility].  He reports that he began taking this medication 2 days ago and he denies having any symptoms or adverse effects from the medication thus far.  Patient denies taking any additional psychotropic medications at this time.  Patient denies having an outpatient psychiatrist at this time.  He reports that he recently completed an intake [facility] [place] and he states that he has his first initial therapy appointment through [facility] [place] on 07/21/2021.  Patient denies history of any previous inpatient psychiatric hospitalizations.  Patient reports that he is currently homeless and has been homeless since 2010.  He denies access to firearms.  Patient reports that he is currently unemployed at this time.  His highest level of education is bachelor degree in business.  Patient states that he does not have any sources of support at this time.  On exam, patient is sitting upright with disheveled appearance, in no acute distress.  Eye contact is good.  Speech is clear and coherent with normal rate and volume.  Mood is depressed and anxious with mood congruent affect.  Patient is alert and oriented x4, cooperative, and answers all questions appropriately during the evaluation.  No indication of patient is responding to internal/external stimuli.  No delusional thought content noted.  PHQ 2-9:   [place] ED from 01/16/2021 in [place] No Risk        Total Time spent with patient: 30 minutes  Musculoskeletal  Strength & Muscle Tone: within normal limits Gait & Station:  normal Patient leans: N/A  Psychiatric Specialty Exam  Presentation General Appearance: Appropriate for Environment; Disheveled  Eye Contact:Good  Speech:Clear and Coherent; Normal Rate  Speech Volume:Normal  Handedness:No data recorded  Mood and Affect  Mood:Depressed; Anxious  Affect:Congruent   Thought Process  Thought Processes:Coherent; Goal Directed; Linear  Descriptions of Associations:Intact  Orientation:Full (Time, Place and Person)  Thought Content:Logical; WDL  Diagnosis of Schizophrenia or Schizoaffective disorder in past: No   Hallucinations:Hallucinations: None  Ideas of Reference:None  Suicidal Thoughts:Suicidal Thoughts: No  Homicidal Thoughts:Homicidal Thoughts: No   Sensorium  Memory:Immediate Good; Recent Good; Remote Good  Judgment:Good  Insight:Good   Executive Functions  Concentration:Good  Attention Span:Good  [place] of Knowledge:Good  Language:Good   Psychomotor Activity  Psychomotor Activity:Psychomotor Activity: Normal   Assets  Assets:Communication Skills; Desire for Improvement; Housing; Leisure Time; Physical Health; Resilience; Talents/Skills   Sleep  Sleep:Sleep: Poor Number of Hours of Sleep: 4   Nutritional Assessment (For OBS and FBC admissions only) Has the patient had a weight loss or gain of 10 pounds or more in the last 3 months?: No Has the patient had a decrease in food intake/or appetite?: Yes (  Patient endorses fluctuating appetite.) Does the patient have dental problems?: No Does the patient have eating habits or behaviors that may be indicators of an eating disorder including binging or inducing vomiting?: No Has the patient recently lost weight without trying?: 0 Has the patient been eating poorly because of a decreased appetite?: 1 (Patient endorses fluctuating appetite.) Malnutrition Screening Tool Score: 1    Physical Exam Vitals reviewed.  Constitutional:      General: He  is not in acute distress.    Appearance: He is not ill-appearing, toxic-appearing or diaphoretic.  HENT:     Head: Normocephalic and atraumatic.     Right Ear: External ear normal.     Left Ear: External ear normal.     Nose: Nose normal.  Eyes:     General:        Right eye: No discharge.        Left eye: No discharge.     Conjunctiva/sclera: Conjunctivae normal.     Comments: PERRLA.  EOMs intact bilaterally.  Cardiovascular:     Comments: Heart regular rate and rhythm.  No murmurs, rubs, or gallops noted. Pulmonary:     Comments: Lungs are clear to auscultation in bilateral anterior and posterior lung fields.  Normal respiratory effort.  No respiratory distress noted.  Respirations are even and unlabored. Musculoskeletal:        General: Normal range of motion.     Cervical back: Normal range of motion.  Neurological:     General: No focal deficit present.     Mental Status: He is alert and oriented to person, place, and time.     Comments: Mild tremor of patient's bilateral hand/upper extremities noted upon extension of patient's bilateral upper extremities.  Psychiatric:        Attention and Perception: Attention normal. He does not perceive auditory or visual hallucinations.        Mood and Affect: Mood is anxious and depressed.        Speech: Speech normal.        Behavior: Behavior normal. Behavior is not agitated, slowed, aggressive, withdrawn, hyperactive or combative. Behavior is cooperative.        Thought Content: Thought content is not paranoid or delusional. Thought content does not include homicidal or suicidal ideation.     Comments: Affect mood-congruent.    Review of Systems  Constitutional:  Positive for diaphoresis and malaise/fatigue. Negative for chills, fever and weight loss.  HENT:  Negative for congestion.   Respiratory:  Negative for cough and shortness of breath.   Cardiovascular:  Negative for chest pain and palpitations.  Gastrointestinal:  Positive  for nausea. Negative for abdominal pain, constipation, diarrhea and vomiting.  Genitourinary:  Positive for frequency.  Musculoskeletal:  Negative for joint pain and myalgias.  Neurological:  Positive for tremors. Negative for dizziness, seizures and headaches.  Psychiatric/Behavioral:  Positive for depression and substance abuse. Negative for hallucinations, memory loss and suicidal ideas. The patient is nervous/anxious and has insomnia.   All other systems reviewed and are negative. Patient endorses history of the following alcohol-related withdrawal symptoms: Anxiety, tremor, diaphoresis, nausea. He endorses being slightly anxious and having slight tremor currently on exam, but he denies experiencing any of the additional above withdrawal symptoms or any additional physical symptoms currently on exam.  He denies history of vomiting during alcohol withdrawal or history of any additional alcohol related withdrawal symptoms.  He denies history of seizures.   Vitals: Blood pressure 137/83, pulse 95,  temperature 98.5 F (36.9 C), temperature source Oral, resp. rate 18, SpO2 94 %. There is no height or weight on file to calculate BMI.  Past Psychiatric History: Reported past psychiatric history of depression.  See HPI for further details regarding patient's past psychiatric history.  Is the patient at risk to self? No  Has the patient been a risk to self in the past 6 months? No .    Has the patient been a risk to self within the distant past? No   Is the patient a risk to others? No   Has the patient been a risk to others in the past 6 months? No   Has the patient been a risk to others within the distant past? No   Past Medical History:  Past Medical History:  Diagnosis Date   Anxiety    Meningitis    as a teenager    Past Surgical History:  Procedure Laterality Date   ANTERIOR CRUCIATE LIGAMENT REPAIR Right 1985    Family History:  Family History  Problem Relation Age of Onset    Migraines Mother    Coronary aneurysm Mother     Social History:  Social History   Socioeconomic History   Marital status: Single    Spouse name: Not on file   Number of children: Not on file   Years of education: Not on file   Highest education level: Not on file  Occupational History   Not on file  Tobacco Use   Smoking status: Never   Smokeless tobacco: Never  Substance and Sexual Activity   Alcohol use: Yes    Comment: occ   Drug use: Never   Sexual activity: Not on file  Other Topics Concern   Not on file  Social History Narrative   Right handed   Social Determinants of Health   Financial Resource Strain: Not on file  Food Insecurity: Not on file  Transportation Needs: Not on file  Physical Activity: Not on file  Stress: Not on file  Social Connections: Not on file  Intimate Partner Violence: Not on file    SDOH:  SDOH Screenings   Alcohol Screen: Not on file  Depression (PHQ2-9): Not on file  Financial Resource Strain: Not on file  Food Insecurity: Not on file  Housing: Not on file  Physical Activity: Not on file  Social Connections: Not on file  Stress: Not on file  Tobacco Use: Low Risk    Smoking Tobacco Use: Never   Smokeless Tobacco Use: Never   Passive Exposure: Not on file  Transportation Needs: Not on file    Last Labs:  Admission on 07/09/2021  Component Date Value Ref Range Status   SARS Coronavirus 2 Ag 07/10/2021 Negative  Negative Preliminary   POC Amphetamine UR 07/10/2021 None Detected  NONE DETECTED (Cut Off Level 1000 ng/mL) Final   POC Secobarbital (BAR) 07/10/2021 None Detected  NONE DETECTED (Cut Off Level 300 ng/mL) Final   POC Buprenorphine (BUP) 07/10/2021 None Detected  NONE DETECTED (Cut Off Level 10 ng/mL) Final   POC Oxazepam (BZO) 07/10/2021 None Detected  NONE DETECTED (Cut Off Level 300 ng/mL) Final   POC Cocaine UR 07/10/2021 None Detected  NONE DETECTED (Cut Off Level 300 ng/mL) Final   POC Methamphetamine UR  07/10/2021 None Detected  NONE DETECTED (Cut Off Level 1000 ng/mL) Final   POC Morphine 07/10/2021 None Detected  NONE DETECTED (Cut Off Level 300 ng/mL) Final   POC Oxycodone UR 07/10/2021 None  Detected  NONE DETECTED (Cut Off Level 100 ng/mL) Final   POC Methadone UR 07/10/2021 None Detected  NONE DETECTED (Cut Off Level 300 ng/mL) Final   POC Marijuana UR 07/10/2021 Positive (A)  NONE DETECTED (Cut Off Level 50 ng/mL) Final  Admission on 01/16/2021, Discharged on 01/17/2021  Component Date Value Ref Range Status   WBC 01/16/2021 4.8  4.0 - 10.5 K/uL Final   RBC 01/16/2021 5.09  4.22 - 5.81 MIL/uL Final   Hemoglobin 01/16/2021 15.4  13.0 - 17.0 g/dL Final   HCT 01/16/2021 46.7  39.0 - 52.0 % Final   MCV 01/16/2021 91.7  80.0 - 100.0 fL Final   MCH 01/16/2021 30.3  26.0 - 34.0 pg Final   MCHC 01/16/2021 33.0  30.0 - 36.0 g/dL Final   RDW 01/16/2021 14.0  11.5 - 15.5 % Final   Platelets 01/16/2021 202  150 - 400 K/uL Final   nRBC 01/16/2021 0.0  0.0 - 0.2 % Final   Neutrophils Relative % 01/16/2021 57  % Final   Neutro Abs 01/16/2021 2.7  1.7 - 7.7 K/uL Final   Lymphocytes Relative 01/16/2021 31  % Final   Lymphs Abs 01/16/2021 1.5  0.7 - 4.0 K/uL Final   Monocytes Relative 01/16/2021 8  % Final   Monocytes Absolute 01/16/2021 0.4  0.1 - 1.0 K/uL Final   Eosinophils Relative 01/16/2021 3  % Final   Eosinophils Absolute 01/16/2021 0.2  0.0 - 0.5 K/uL Final   Basophils Relative 01/16/2021 1  % Final   Basophils Absolute 01/16/2021 0.1  0.0 - 0.1 K/uL Final   Immature Granulocytes 01/16/2021 0  % Final   Abs Immature Granulocytes 01/16/2021 0.01  0.00 - 0.07 K/uL Final   Performed at [facility], [place] [address] [place], [place] [address]   Sodium 01/16/2021 140  135 - 145 mmol/L Final   Potassium 01/16/2021 4.0  3.5 - 5.1 mmol/L Final   Chloride 01/16/2021 106  98 - 111 mmol/L Final   BUN 01/16/2021 10  6 - 20 mg/dL Final   Creatinine, Ser 01/16/2021 0.80  0.61 - 1.24 mg/dL  Final   Glucose, Bld 01/16/2021 120 (H)  70 - 99 mg/dL Final   Glucose reference range applies only to samples taken after fasting for at least 8 hours.   Calcium, Ion 01/16/2021 1.16  1.15 - 1.40 mmol/L Final   TCO2 01/16/2021 23  22 - 32 mmol/L Final   Hemoglobin 01/16/2021 15.6  13.0 - 17.0 g/dL Final   HCT 01/16/2021 46.0  39.0 - 52.0 % Final   Sodium 01/16/2021 134 (L)  135 - 145 mmol/L Final   Potassium 01/16/2021 3.9  3.5 - 5.1 mmol/L Final   Chloride 01/16/2021 103  98 - 111 mmol/L Final   CO2 01/16/2021 19 (L)  22 - 32 mmol/L Final   Glucose, Bld 01/16/2021 117 (H)  70 - 99 mg/dL Final   Glucose reference range applies only to samples taken after fasting for at least 8 hours.   BUN 01/16/2021 10  6 - 20 mg/dL Final   Creatinine, Ser 01/16/2021 0.89  0.61 - 1.24 mg/dL Final   Calcium 01/16/2021 8.9  8.9 - 10.3 mg/dL Final   Total Protein 01/16/2021 6.8  6.5 - 8.1 g/dL Final   Albumin 01/16/2021 3.8  3.5 - 5.0 g/dL Final   AST 01/16/2021 29  15 - 41 U/L Final   ALT 01/16/2021 55 (H)  0 - 44 U/L Final  Alkaline Phosphatase 01/16/2021 55  38 - 126 U/L Final   Total Bilirubin 01/16/2021 1.0  0.3 - 1.2 mg/dL Final   GFR, Estimated 01/16/2021 >60  >60 mL/min Final   Comment: (NOTE) Calculated using the CKD-EPI Creatinine Equation (2021)    Anion gap 01/16/2021 12  5 - 15 Final   Performed at [place] [address] [place], [place] [address]   Prothrombin Time 01/16/2021 12.4  11.4 - 15.2 seconds Final   INR 01/16/2021 0.9  0.8 - 1.2 Final   Comment: (NOTE) INR goal varies based on device and disease states. Performed at [facility], [place] [address], [place], [place] [address]     Allergies: Patient has no known allergies.  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  Patient is not actively psychotic on exam. Based on patient's history and current presentation of worsening depressive symptoms and pattern of substance abuse without suicidal ideation or signs of psychosis, patient meets criteria for [facility] facility based crisis [facility]) admission at this time.     Recommendations  Based on my evaluation the patient does not appear to have an emergency medical condition.  Recommend [facility] admission for the patient. Patient to be admitted to [facility].   Labs ordered and reviewed:  -PCR Flu A&B, COVID: Negative  -UDS: Positive for marijuana  -CBC with differential: Within normal limits  -CMP: CO2 slightly reduced at 21 mmol/L (essentially normal).  Serum glucose slightly elevated at 107 mg/dL.  ALT slightly elevated at 56 U/L (similar to previous value of 55 from 5 mo ago, likely due to patient's alcohol abuse)  Based on my exam and patient's presentation, these lab values do not appear to be indicative of an emergent medical condition at this time. CMP otherwise unremarkable.  -Ethanol: Elevated at 66 mg/dL (Patient does not appear to be acutely intoxicated on exam).  -Hemoglobin A1c: Within normal limits at 5.4%  -Lipid panel: Total cholesterol slightly elevated at 215 mg/dL.  Triglycerides slightly elevated at 167 mg/dL.  LDL cholesterol elevated at 126 mg/dL.  Lipid panel otherwise unremarkable.  -TSH: Within normal limits at 1.962 uIU/mL  Will continue the following home medications at this time:  -Celexa 20 mg p.o. daily for depression/MDD  -Amlodipine 10 mg p.o. daily for hypertension  -Hydrochlorothiazide 25 mg p.o. daily for hypertension  -Meloxicam 15 mg p.o. daily as needed for chronic right knee pain  -Flomax 0.4 mg p.o. daily at bedtime for urinary frequency/BPH symptoms  Patient is not currently  diaphoretic on exam.  Patient appears to be starting to show mild alcohol withdrawal, as patient does have mild tremor of upper extremities on exam.  Patient endorses anxiety and tremor on exam, but denies any additional physical symptoms at this time.  We will give 1 time loading dose of Ativan now for alcohol withdrawal due to patient's schedule Ativan withdrawal taper not start until later this morning on 07/10/2021 at 1000.  For alcohol withdrawal, will initiate CIWA protocol with the following medication orders:  -Ativan taper:   -Ativan 1 mg p.o. 4 times daily (first dose scheduled for 07/10/2021 at 1000) for 4 doses followed by   -Ativan  1 mg p.o. 3 times daily (first dose scheduled for 07/11/2021 at 1000) for 3 doses followed by   -Ativan 1 mg p.o. twice daily (first dose scheduled for 07/12/2021 at 1000) for 2 doses followed by   -Ativan 1 mg p.o. daily (scheduled for 07/13/2021 at 10,000) for 1 dose  -Ativan 1 mg p.o. every 6 hours as needed for CIWA greater than 10  -Hydroxyzine 25 mg p.o. every 6 hours as needed for anxiety/agitation or CIWA less than or equal to 10  -Imodium capsule 2 to 4 mg p.o. as needed for diarrhea or loose stools  -Multivitamin with minerals p.o. daily for nutritional supplementation  -Zofran ODT 4 mg p.o. every 6 hours as needed for nausea/vomiting  -Thiamine 100 mg p.o. daily for nutritional supplementation  Additional as needed medications ordered:  -Maalox/Mylanta 30 mL p.o. every 4 hours as needed for indigestion  -Milk of Magnesia 30 mL p.o. daily as needed for mild constipation  -Trazodone 50 mg p.o. at bedtime as needed for sleep  Patient educated on side effect profile of Ativan, hydroxyzine, and trazodone.  Patient to discuss with the dayshift treatment team on 07/10/2021 his wishes for transition of care to day [facility] for further substance abuse treatment following his [facility] stay.  Prescilla Sours, PA-C 07/10/21  4:40 AM

## 2021-07-10 NOTE — Clinical Social Work Psych Note (Signed)
LCSW Initial Note    LCSW met with Allen Cooke for introduction and to begin discussions regarding treatment and potential discharge planning.   Allen Cooke presented with a depressed affect, congruent mood. Allen Cooke denied having any SI, HI or AVH at this time.   Allen Cooke reports that he presented to the [facility] seeking assistance for substnace abuse issues. He reports he has a hoistory of methamphetmine and ETOH abuse. Allen Cooke shared that she stopped using methamphetmines on 05/30/21, however he continues to abuse alcohol.   Allen Cooke endorsed drinking 1/2 pint of ETOH on a daily basis. Allen Cooke reports his last use of alcohol was yesterday around 7:00pm, which he dranked a fifth of liqour prior to coming to this facility.   Allen Cooke requested to be referred to a residential treatment program for continuity of care, once he completed her detox services at the [facility].    Allen Cooke was started on an Ativan taper to address withdrawal symptoms. His taper ends Monday, 07/13/21.   Allen Cooke is currently uninsured. LCSW will refer Allen Cooke to the following facilities for review:  Allen Cooke requested to be referred to a residential treatment program for continuity of care, once he completed her detox services at the [facility].    Allen Cooke was started on an Ativan taper to address withdrawal symptoms. His taper ends Monday, 07/13/21.    LCSW will continue to follow for possible placement.    Radonna Ricker, MSW, LCSW Clinical Education officer, museum ([place]) [facility]

## 2021-07-10 NOTE — ED Notes (Signed)
Allen Cooke admits to feeling of depression and anxiety related to his history of substance abuse and is requesting long term rehab. Pt displaces a flat affect mood labile  medicated with 1 mg ativan d/t anxiety. Pt ate 100% of a beef stew meal tray and went to sleep.

## 2021-07-10 NOTE — ED Notes (Signed)
Pt did not participate in group °

## 2021-07-10 NOTE — Progress Notes (Signed)
Pt is presently in chaplin group therapy. No distress noted. Monitoring for pt's safety.

## 2021-07-10 NOTE — ED Provider Notes (Signed)
Behavioral Health Progress Note  Date and Time: 07/10/2021 11:56 AM Name: Allen Cooke MRN:  [id number]  Subjective:   61 year old male with reported past psychiatric history significant for depression and past medical history significant for history of hypertension, strain of right Achilles tendon, and chronic right knee pain, who presents to the [facility] behavioral health urgent care [facility]) unaccompanied as a voluntary walk-in for symptoms of worsening depression and substance abuse (currently alcohol abuse) on 07/09/2021; patient reported that he was directed by [facility] staff to present to [facility] for the purpose of detox prior to admission to their program.    Patient seen and chart reviewed-he has been started on ativan taper for etoh withdrawal that is scheduled to end Monday 07/13/21 and was restarted on his home medications.  Patient interviewed in conjunction with LCSW this morning.  Patient describes his mood is "drowsy" and states that he has mostly slept since his admission to the [facility] yesterday.  Patient recounts what led to hospitalization as per H&P.  Patient reports that his last drink was yesterday just prior to presentation. Pt goes on to state "my situation" is one of the factors that is motivating him to seek treatment.  Patient states he has been homeless since 2010 at which point he  "got involved in a relationship with a girl   who influenced me a lot and I made bad choices".  He denies SI/HI/AVH.  He denies all alcohol withdrawal symptoms symptoms including but not limited to nausea, vomiting, headache, GI upset, tremors.  Discussed with patient that he is currently on Ativan taper which is scheduled to end Monday 2/13 at which point he could be accepted to day [facility].  Patient verbalized understanding.  Patient provided consent for LCSW to fax information to day [facility]. Patient with no other complaints or questions at this time. Patient was given the opportunity to ask questions  and  All questions answered. Patient verbalized understanding regarding plan of care.        Diagnosis:  Final diagnoses:  MDD (major depressive disorder), recurrent severe, without psychosis ([place])  Alcohol use disorder, severe, dependence ([place])    Total Time spent with patient: 20 minutes  Past Psychiatric History: depression, anxietuy, alcohol use Past Medical History:  Past Medical History:  Diagnosis Date   Anxiety    Meningitis    as a teenager    Past Surgical History:  Procedure Laterality Date   ANTERIOR CRUCIATE LIGAMENT REPAIR Right 1985   Family History:  Family History  Problem Relation Age of Onset   Migraines Mother    Coronary aneurysm Mother    Family Psychiatric  History: none reported Social History:  Social History   Substance and Sexual Activity  Alcohol Use Yes   Comment: occ     Social History   Substance and Sexual Activity  Drug Use Never    Social History   Socioeconomic History   Marital status: Single    Spouse name: Not on file   Number of children: Not on file   Years of education: Not on file   Highest education level: Not on file  Occupational History   Not on file  Tobacco Use   Smoking status: Never   Smokeless tobacco: Never  Substance and Sexual Activity   Alcohol use: Yes    Comment: occ   Drug use: Never   Sexual activity: Not on file  Other Topics Concern   Not on file  Social History Narrative  Right handed   Social Determinants of Health   Financial Resource Strain: Not on file  Food Insecurity: Not on file  Transportation Needs: Not on file  Physical Activity: Not on file  Stress: Not on file  Social Connections: Not on file   SDOH:  SDOH Screenings   Alcohol Screen: Not on file  Depression (PHQ2-9): Not on file  Financial Resource Strain: Not on file  Food Insecurity: Not on file  Housing: Not on file  Physical Activity: Not on file  Social Connections: Not on file  Stress: Not on file   Tobacco Use: Low Risk    Smoking Tobacco Use: Never   Smokeless Tobacco Use: Never   Passive Exposure: Not on file  Transportation Needs: Not on file   Additional Social History:    Pain Medications: None Prescriptions: Celexa, a BP med; a lasics; an arthritis medicine.  A prostate med. Over the Counter: None History of alcohol / drug use?: Yes Longest period of sobriety (when/how long): About 90-120 days in 2021. Negative Consequences of Use: Personal relationships, Work / Youth worker, Museum/gallery curator Withdrawal Symptoms: Patient aware of relationship between substance abuse and physical/medical complications, Tremors, Weakness, Agitation, Sweats, Fever / Chills, Nausea / Vomiting, Diarrhea, Cramps Name of Substance 1: ETOH (vodka) 1 - Age of First Use: 61 years of age 7 - Amount (size/oz): 1 pint up to a 5th per day 1 - Frequency: Daily use 1 - Duration: Last 5 years at that rate 1 - Last Use / Amount: 02/09 he drank a 5th PTA 1 - Method of Aquiring: purchase 1- Route of Use: oral.                  Sleep: Poor  Appetite:  Fair  Current Medications:  Current Facility-Administered Medications  Medication Dose Route Frequency Provider Last Rate Last Admin   alum & mag hydroxide-simeth (MAALOX/MYLANTA) 200-200-20 MG/5ML suspension 30 mL  30 mL Oral Q4H PRN Lovena Le, Cody W, PA-C       amLODipine (NORVASC) tablet 10 mg  10 mg Oral Daily [name] W, PA-C   10 mg at 07/10/21 1047   citalopram (CELEXA) tablet 20 mg  20 mg Oral Daily [name] W, PA-C   20 mg at 07/10/21 1047   hydrochlorothiazide (HYDRODIURIL) tablet 25 mg  25 mg Oral Daily [name] W, PA-C   25 mg at 07/10/21 1047   hydrOXYzine (ATARAX) tablet 25 mg  25 mg Oral Q6H PRN [name] W, PA-C       loperamide (IMODIUM) capsule 2-4 mg  2-4 mg Oral PRN [name] W, PA-C       LORazepam (ATIVAN) tablet 1 mg  1 mg Oral Q6H PRN [name] W, PA-C       LORazepam (ATIVAN) tablet 1 mg  1 mg Oral QID [name]  W, PA-C   1 mg at 07/10/21 1046   Followed by   Derrill Memo ON 07/11/2021] LORazepam (ATIVAN) tablet 1 mg  1 mg Oral TID Prescilla Sours, PA-C       Followed by   Derrill Memo ON 07/12/2021] LORazepam (ATIVAN) tablet 1 mg  1 mg Oral BID Prescilla Sours, PA-C       Followed by   Derrill Memo ON 07/13/2021] LORazepam (ATIVAN) tablet 1 mg  1 mg Oral Daily Lovena Le, Cody W, PA-C       magnesium hydroxide (MILK OF MAGNESIA) suspension 30 mL  30 mL Oral Daily PRN Prescilla Sours, PA-C  meloxicam (MOBIC) tablet 15 mg  15 mg Oral Daily PRN Prescilla Sours, PA-C   15 mg at 07/10/21 1050   multivitamin with minerals tablet 1 tablet  1 tablet Oral Daily Prescilla Sours, PA-C   1 tablet at 07/10/21 1047   ondansetron (ZOFRAN-ODT) disintegrating tablet 4 mg  4 mg Oral Q6H PRN Prescilla Sours, PA-C       tamsulosin [facility]) capsule 0.4 mg  0.4 mg Oral QHS Prescilla Sours, PA-C       [START ON 07/11/2021] thiamine tablet 100 mg  100 mg Oral Daily [name] W, PA-C       traZODone (DESYREL) tablet 50 mg  50 mg Oral QHS PRN Prescilla Sours, PA-C       Current Outpatient Medications  Medication Sig Dispense Refill   amLODipine (NORVASC) 10 MG tablet Take 10 mg by mouth daily.     CELEXA 20 MG tablet Take 20 mg by mouth daily.     FLOMAX 0.4 MG CAPS capsule Take 0.4 mg by mouth at bedtime.     hydrochlorothiazide (HYDRODIURIL) 25 MG tablet Take 25 mg by mouth daily.     meloxicam (MOBIC) 15 MG tablet Take 15 mg by mouth daily as needed.      Labs  Lab Results:  Admission on 07/09/2021  Component Date Value Ref Range Status   SARS Coronavirus 2 by RT PCR 07/10/2021 NEGATIVE  NEGATIVE Final   Comment: (NOTE) SARS-CoV-2 target nucleic acids are NOT DETECTED.  The SARS-CoV-2 RNA is generally detectable in upper respiratory specimens during the acute phase of infection. The lowest concentration of SARS-CoV-2 viral copies this assay can detect is 138 copies/mL. A negative result does not preclude SARS-Cov-2 infection and should  not be used as the sole basis for treatment or other patient management decisions. A negative result may occur with  improper specimen collection/handling, submission of specimen other than nasopharyngeal swab, presence of viral mutation(s) within the areas targeted by this assay, and inadequate number of viral copies(<138 copies/mL). A negative result must be combined with clinical observations, patient history, and epidemiological information. The expected result is Negative.  Fact Sheet for Patients:  EntrepreneurPulse.com.au  Fact Sheet for Healthcare Providers:  IncredibleEmployment.be  This test is no                          t yet approved or cleared by the [place] FDA and  has been authorized for detection and/or diagnosis of SARS-CoV-2 by FDA under an Emergency Use Authorization (EUA). This EUA will remain  in effect (meaning this test can be used) for the duration of the COVID-19 declaration under Section 564(b)(1) of the Act, 21 U.S.C.section 360bbb-3(b)(1), unless the authorization is terminated  or revoked sooner.       Influenza A by PCR 07/10/2021 NEGATIVE  NEGATIVE Final   Influenza B by PCR 07/10/2021 NEGATIVE  NEGATIVE Final   Comment: (NOTE) The Xpert Xpress SARS-CoV-2/FLU/RSV plus assay is intended as an aid in the diagnosis of influenza from Nasopharyngeal swab specimens and should not be used as a sole basis for treatment. Nasal washings and aspirates are unacceptable for Xpert Xpress SARS-CoV-2/FLU/RSV testing.  Fact Sheet for Patients: EntrepreneurPulse.com.au  Fact Sheet for Healthcare Providers: IncredibleEmployment.be  This test is not yet approved or cleared by the [place] FDA and has been authorized for detection and/or diagnosis of SARS-CoV-2 by FDA under an Emergency Use Authorization (EUA). This  EUA will remain in effect (meaning this test can be used) for the  duration of the COVID-19 declaration under Section 564(b)(1) of the Act, 21 U.S.C. section 360bbb-3(b)(1), unless the authorization is terminated or revoked.  Performed at [facility], [place] [address] [place], [place] [address]    SARS Coronavirus 2 Ag 07/10/2021 Negative  Negative Preliminary   WBC 07/10/2021 5.4  4.0 - 10.5 K/uL Final   RBC 07/10/2021 5.18  4.22 - 5.81 MIL/uL Final   Hemoglobin 07/10/2021 15.7  13.0 - 17.0 g/dL Final   HCT 07/10/2021 46.3  39.0 - 52.0 % Final   MCV 07/10/2021 89.4  80.0 - 100.0 fL Final   MCH 07/10/2021 30.3  26.0 - 34.0 pg Final   MCHC 07/10/2021 33.9  30.0 - 36.0 g/dL Final   RDW 07/10/2021 14.1  11.5 - 15.5 % Final   Platelets 07/10/2021 222  150 - 400 K/uL Final   nRBC 07/10/2021 0.0  0.0 - 0.2 % Final   Neutrophils Relative % 07/10/2021 54  % Final   Neutro Abs 07/10/2021 2.9  1.7 - 7.7 K/uL Final   Lymphocytes Relative 07/10/2021 35  % Final   Lymphs Abs 07/10/2021 1.9  0.7 - 4.0 K/uL Final   Monocytes Relative 07/10/2021 7  % Final   Monocytes Absolute 07/10/2021 0.4  0.1 - 1.0 K/uL Final   Eosinophils Relative 07/10/2021 3  % Final   Eosinophils Absolute 07/10/2021 0.2  0.0 - 0.5 K/uL Final   Basophils Relative 07/10/2021 1  % Final   Basophils Absolute 07/10/2021 0.1  0.0 - 0.1 K/uL Final   Immature Granulocytes 07/10/2021 0  % Final   Abs Immature Granulocytes 07/10/2021 0.02  0.00 - 0.07 K/uL Final   Performed at [facility], [place] [address] [place], [place] [address]   Sodium 07/10/2021 136  135 - 145 mmol/L Final   Potassium 07/10/2021 3.7  3.5 - 5.1 mmol/L Final   Chloride 07/10/2021 101  98 - 111 mmol/L Final   CO2 07/10/2021 21 (L)  22 - 32 mmol/L Final   Glucose, Bld 07/10/2021 107 (H)  70 - 99 mg/dL Final   Glucose reference range applies only to samples taken after fasting for at least 8 hours.   BUN 07/10/2021 20  8 - 23 mg/dL Final   Creatinine, Ser 07/10/2021 0.88  0.61 - 1.24 mg/dL Final   Calcium  07/10/2021 9.3  8.9 - 10.3 mg/dL Final   Total Protein 07/10/2021 6.8  6.5 - 8.1 g/dL Final   Albumin 07/10/2021 4.1  3.5 - 5.0 g/dL Final   AST 07/10/2021 29  15 - 41 U/L Final   ALT 07/10/2021 56 (H)  0 - 44 U/L Final   Alkaline Phosphatase 07/10/2021 50  38 - 126 U/L Final   Total Bilirubin 07/10/2021 0.9  0.3 - 1.2 mg/dL Final   GFR, Estimated 07/10/2021 >60  >60 mL/min Final   Comment: (NOTE) Calculated using the CKD-EPI Creatinine Equation (2021)    Anion gap 07/10/2021 14  5 - 15 Final   Performed at [place] [address] [place], [place] [address]   Hgb A1c MFr Bld 07/10/2021 5.4  4.8 - 5.6 % Final   Comment: (NOTE) Pre diabetes:          5.7%-6.4%  Diabetes:              >6.4%  Glycemic control for   <7.0% adults with diabetes  Mean Plasma Glucose 07/10/2021 108.28  mg/dL Final   Performed at [place] [address] [place], [place] [address]   Alcohol, Ethyl (B) 07/10/2021 66 (H)  <10 mg/dL Final   Comment: (NOTE) Lowest detectable limit for serum alcohol is 10 mg/dL.  For medical purposes only. Performed at [facility], [place] [address] [place], [place] [address]    TSH 07/10/2021 1.962  0.350 - 4.500 uIU/mL Final   Comment: Performed by a 3rd Generation assay with a functional sensitivity of <=0.01 uIU/mL. Performed at [facility], [place] [address] [place], [place] [address]    POC Amphetamine UR 07/10/2021 None Detected  NONE DETECTED (Cut Off Level 1000 ng/mL) Final   POC Secobarbital (BAR) 07/10/2021 None Detected  NONE DETECTED (Cut Off Level 300 ng/mL) Final   POC Buprenorphine (BUP) 07/10/2021 None Detected  NONE DETECTED (Cut Off Level 10 ng/mL) Final   POC Oxazepam (BZO) 07/10/2021 None Detected  NONE DETECTED (Cut Off Level 300 ng/mL) Final   POC Cocaine UR 07/10/2021 None Detected  NONE DETECTED (Cut Off Level 300 ng/mL) Final   POC Methamphetamine UR 07/10/2021 None Detected  NONE DETECTED (Cut Off Level 1000  ng/mL) Final   POC Morphine 07/10/2021 None Detected  NONE DETECTED (Cut Off Level 300 ng/mL) Final   POC Oxycodone UR 07/10/2021 None Detected  NONE DETECTED (Cut Off Level 100 ng/mL) Final   POC Methadone UR 07/10/2021 None Detected  NONE DETECTED (Cut Off Level 300 ng/mL) Final   POC Marijuana UR 07/10/2021 Positive (A)  NONE DETECTED (Cut Off Level 50 ng/mL) Final   Cholesterol 07/10/2021 215 (H)  0 - 200 mg/dL Final   Triglycerides 07/10/2021 167 (H)  <150 mg/dL Final   HDL 07/10/2021 56  >40 mg/dL Final   Total CHOL/HDL Ratio 07/10/2021 3.8  RATIO Final   VLDL 07/10/2021 33  0 - 40 mg/dL Final   LDL Cholesterol 07/10/2021 126 (H)  0 - 99 mg/dL Final   Comment:        Total Cholesterol/HDL:CHD Risk Coronary Heart Disease Risk Table                     Men   Women  1/2 Average Risk   3.4   3.3  Average Risk       5.0   4.4  2 X Average Risk   9.6   7.1  3 X Average Risk  23.4   11.0        Use the calculated Patient Ratio above and the CHD Risk Table to determine the patient's CHD Risk.        ATP III CLASSIFICATION (LDL):  <100     mg/dL   Optimal  100-129  mg/dL   Near or Above                    Optimal  130-159  mg/dL   Borderline  160-189  mg/dL   High  >190     mg/dL   Very High Performed at [place] [address], [place],  [address]   Admission on 01/16/2021, Discharged on 01/17/2021  Component Date Value Ref Range Status   WBC 01/16/2021 4.8  4.0 - 10.5 K/uL Final   RBC 01/16/2021 5.09  4.22 - 5.81 MIL/uL Final   Hemoglobin 01/16/2021 15.4  13.0 - 17.0 g/dL Final   HCT 01/16/2021 46.7  39.0 - 52.0 % Final  MCV 01/16/2021 91.7  80.0 - 100.0 fL Final   MCH 01/16/2021 30.3  26.0 - 34.0 pg Final   MCHC 01/16/2021 33.0  30.0 - 36.0 g/dL Final   RDW 01/16/2021 14.0  11.5 - 15.5 % Final   Platelets 01/16/2021 202  150 - 400 K/uL Final   nRBC 01/16/2021 0.0  0.0 - 0.2 % Final   Neutrophils Relative % 01/16/2021 57  % Final   Neutro Abs 01/16/2021  2.7  1.7 - 7.7 K/uL Final   Lymphocytes Relative 01/16/2021 31  % Final   Lymphs Abs 01/16/2021 1.5  0.7 - 4.0 K/uL Final   Monocytes Relative 01/16/2021 8  % Final   Monocytes Absolute 01/16/2021 0.4  0.1 - 1.0 K/uL Final   Eosinophils Relative 01/16/2021 3  % Final   Eosinophils Absolute 01/16/2021 0.2  0.0 - 0.5 K/uL Final   Basophils Relative 01/16/2021 1  % Final   Basophils Absolute 01/16/2021 0.1  0.0 - 0.1 K/uL Final   Immature Granulocytes 01/16/2021 0  % Final   Abs Immature Granulocytes 01/16/2021 0.01  0.00 - 0.07 K/uL Final   Performed at [facility], [place] [address], [place], [place] [address]   Sodium 01/16/2021 140  135 - 145 mmol/L Final   Potassium 01/16/2021 4.0  3.5 - 5.1 mmol/L Final   Chloride 01/16/2021 106  98 - 111 mmol/L Final   BUN 01/16/2021 10  6 - 20 mg/dL Final   Creatinine, Ser 01/16/2021 0.80  0.61 - 1.24 mg/dL Final   Glucose, Bld 01/16/2021 120 (H)  70 - 99 mg/dL Final   Glucose reference range applies only to samples taken after fasting for at least 8 hours.   Calcium, Ion 01/16/2021 1.16  1.15 - 1.40 mmol/L Final   TCO2 01/16/2021 23  22 - 32 mmol/L Final   Hemoglobin 01/16/2021 15.6  13.0 - 17.0 g/dL Final   HCT 01/16/2021 46.0  39.0 - 52.0 % Final   Sodium 01/16/2021 134 (L)  135 - 145 mmol/L Final   Potassium 01/16/2021 3.9  3.5 - 5.1 mmol/L Final   Chloride 01/16/2021 103  98 - 111 mmol/L Final   CO2 01/16/2021 19 (L)  22 - 32 mmol/L Final   Glucose, Bld 01/16/2021 117 (H)  70 - 99 mg/dL Final   Glucose reference range applies only to samples taken after fasting for at least 8 hours.   BUN 01/16/2021 10  6 - 20 mg/dL Final   Creatinine, Ser 01/16/2021 0.89  0.61 - 1.24 mg/dL Final   Calcium 01/16/2021 8.9  8.9 - 10.3 mg/dL Final   Total Protein 01/16/2021 6.8  6.5 - 8.1 g/dL Final   Albumin 01/16/2021 3.8  3.5 - 5.0 g/dL Final   AST 01/16/2021 29  15 - 41 U/L Final   ALT 01/16/2021 55 (H)  0 - 44 U/L Final   Alkaline Phosphatase  01/16/2021 55  38 - 126 U/L Final   Total Bilirubin 01/16/2021 1.0  0.3 - 1.2 mg/dL Final   GFR, Estimated 01/16/2021 >60  >60 mL/min Final   Comment: (NOTE) Calculated using the CKD-EPI Creatinine Equation (2021)    Anion gap 01/16/2021 12  5 - 15 Final   Performed at [place] [address] [place], [place] [address]   Prothrombin Time 01/16/2021 12.4  11.4 - 15.2 seconds Final   INR 01/16/2021 0.9  0.8 - 1.2 Final   Comment: (NOTE) INR goal varies based on device  and disease states. Performed at [facility], [place] [address] [place], [place] [address]     Blood Alcohol level:  Lab Results  Component Value Date   ETH 66 (H) 0000000    Metabolic Disorder Labs: Lab Results  Component Value Date   HGBA1C 5.4 07/10/2021   MPG 108.28 07/10/2021   No results found for: PROLACTIN Lab Results  Component Value Date   CHOL 215 (H) 07/10/2021   TRIG 167 (H) 07/10/2021   HDL 56 07/10/2021   CHOLHDL 3.8 07/10/2021   VLDL 33 07/10/2021   LDLCALC 126 (H) 07/10/2021    Therapeutic Lab Levels: No results found for: LITHIUM No results found for: VALPROATE No components found for:  CBMZ  Physical Findings   Flowsheet Row ED from 07/09/2021 in [facility] ED from 01/16/2021 in [place] No Risk No Risk        Musculoskeletal  Strength & Muscle Tone: within normal limits Gait & Station: normal Patient leans: N/A  Psychiatric Specialty Exam  Presentation  General Appearance: Appropriate for Environment; Disheveled  Eye Contact:Good  Speech:Clear and Coherent; Normal Rate  Speech Volume:Normal  Handedness:No data recorded  Mood and Affect  Mood:Depressed; Anxious  Affect:Congruent; Constricted   Thought Process  Thought Processes:Coherent; Goal Directed; Linear  Descriptions of Associations:Intact  Orientation:Full (Time, Place and Person)  Thought  Content:WDL; Logical  Diagnosis of Schizophrenia or Schizoaffective disorder in past: No    Hallucinations:Hallucinations: None  Ideas of Reference:None  Suicidal Thoughts:Suicidal Thoughts: No  Homicidal Thoughts:Homicidal Thoughts: No   Sensorium  Memory:Immediate Good; Recent Good; Remote Good  Judgment:Good  Insight:Good   Executive Functions  Concentration:Good  Attention Span:Good  [place] of Knowledge:Good  Language:Good   Psychomotor Activity  Psychomotor Activity:Psychomotor Activity: Normal   Assets  Assets:Communication Skills; Desire for Improvement; Resilience   Sleep  Sleep:Sleep: Poor Number of Hours of Sleep: 4   Nutritional Assessment (For OBS and FBC admissions only) Has the patient had a weight loss or gain of 10 pounds or more in the last 3 months?: No Has the patient had a decrease in food intake/or appetite?: Yes (Patient endorses fluctuating appetite.) Does the patient have dental problems?: No Does the patient have eating habits or behaviors that may be indicators of an eating disorder including binging or inducing vomiting?: No Has the patient recently lost weight without trying?: 0 Has the patient been eating poorly because of a decreased appetite?: 1 (Patient endorses fluctuating appetite.) Malnutrition Screening Tool Score: 1    Physical Exam  Physical Exam Constitutional:      Appearance: Normal appearance. He is normal weight.     Comments: Disheveled   HENT:     Head: Normocephalic and atraumatic.  Eyes:     Extraocular Movements: Extraocular movements intact.  Pulmonary:     Effort: Pulmonary effort is normal.  Neurological:     General: No focal deficit present.     Mental Status: He is alert and oriented to person, place, and time.  Psychiatric:        Attention and Perception: Attention and perception normal.        Speech: Speech normal.        Behavior: Behavior normal. Behavior is cooperative.         Thought Content: Thought content normal.   Review of Systems  Constitutional:  Negative for chills and fever.  HENT:  Negative for hearing  loss.   Eyes:  Negative for discharge and redness.  Respiratory:  Negative for cough.   Cardiovascular:  Negative for chest pain.  Gastrointestinal:  Negative for abdominal pain, diarrhea, nausea and vomiting.  Musculoskeletal:  Negative for myalgias.  Neurological:  Negative for headaches.  Psychiatric/Behavioral:  Positive for substance abuse. Negative for hallucinations. The patient has insomnia. The patient is not nervous/anxious.   Blood pressure (!) 169/100, pulse 68, temperature 97.9 F (36.6 C), temperature source Oral, resp. rate 18, SpO2 97 %. There is no height or weight on file to calculate BMI.  Treatment Plan Summary: 61 year old male with reported past psychiatric history significant for depression and past medical history significant for history of hypertension, strain of right Achilles tendon, and chronic right knee pain, who presents to the [facility] behavioral health urgent care [facility]) unaccompanied as a voluntary walk-in for symptoms of worsening depression and substance abuse (currently alcohol abuse) on 07/09/2021; patient reported that he was directed by [facility] staff to present to [facility] for the purpose of detox prior to admission to their program.    Patient tolerating ativan taper well without issue. He denies sx of etoh withdrawal including nausea, vomiting, headache, gi upset, tremors.He remains appropriate for continued treatment at the [facility] for crisis stabilization and etoh detox. Patient interested in residential rehab- LCSW to assist with seeking placement    AUD, severe -continue CIWA protocol -continue ativan detox- 1 mg QID --> 1 mg TID--> 1 mg BID--> 1 mg daily -multivitamin -thiamine -PRNs for etoh withdrawal -patient interested in residential treatment- LCSW seeking placement  Depression Anxiety -continue  home celexa 20 mg daily  HTN -continue home norvasc 10 mg daily -continue home hctz 25 mg daily  Chronic knee pain -continue home prn mobic 15 mg   BPH -continue home flomax 0.4 mg   Dispo: ongoing. SW assisting. Likely residential rehab once ativan taper is completed on 07/13/21  Ival Bible, MD 07/10/2021 11:56 AM

## 2021-07-10 NOTE — Progress Notes (Signed)
Pt's CIWA was 5. °

## 2021-07-10 NOTE — Progress Notes (Signed)
Pt's CIWA was 4. 

## 2021-07-10 NOTE — Progress Notes (Signed)
SPIRITUALITY GROUP NOTE  Spirituality group facilitated by Wilkie Aye, MDiv, [facility].  Group Description:  Group focused on topic of hope.  Patients participated in facilitated discussion around topic, connecting with one another around experiences and definitions for hope.  Group members engaged in facilitated discussion reflecting on the phrase "hope is."  Group engaged in discussion around how their definitions of hope are present today in hospital.   Modalities: Psycho-social ed, Adlerian, Narrative, MI Patient Progress: Allen Cooke was present throughout group.  Engaged in discussion with facilitator and group members.  Spoke of hope in attending rehab, setting goals and creating momentum.

## 2021-07-11 MED ORDER — LORATADINE 10 MG PO TABS
10.0000 mg | ORAL_TABLET | Freq: Every day | ORAL | Status: DC | PRN
  Administered 2021-07-11 – 2021-07-12 (×2): 10 mg via ORAL
  Filled 2021-07-11: qty 1
  Filled 2021-07-11: qty 14

## 2021-07-11 MED ORDER — SALINE SPRAY 0.65 % NA SOLN: 1.0000 | Freq: Every day | NASAL | Status: DC | PRN

## 2021-07-11 MED ORDER — SALINE SPRAY 0.65 % NA SOLN
1.0000 | Freq: Four times a day (QID) | NASAL | Status: DC | PRN
  Administered 2021-07-11 – 2021-07-12 (×2): 1 via NASAL
  Filled 2021-07-11 (×2): qty 44

## 2021-07-11 NOTE — ED Notes (Signed)
Patient is resting, he attended group and participated.  Patient will continued to be monitored for safety.

## 2021-07-11 NOTE — ED Notes (Signed)
Patient declined to attended wrap up and AA group even after encouragement from staff.

## 2021-07-11 NOTE — ED Notes (Signed)
Breakfast was given 

## 2021-07-11 NOTE — ED Notes (Signed)
Pt sleeping@this time. Breathing even and unlabored. Will continue to monitor for safety 

## 2021-07-11 NOTE — ED Notes (Signed)
Patient denies SI/HI and AVH. Patient ate breakfast and took his morning medications. Patient has been calm and cooperative. Patient will continued to be monitored for safety.

## 2021-07-11 NOTE — ED Notes (Addendum)
Pt complained about being congested and requested for nasal spray and Claritin.

## 2021-07-11 NOTE — ED Provider Notes (Addendum)
Behavioral Health Progress Note  Date and Time: 07/11/2021 10:45 AM Name: Allen Cooke MRN:  [id number]  Subjective:  Allen Cooke, 61 y.o., male patient who initially presented to [facility] on 07/09/2021 due to increased depression and requesting substance abuse treatment related to alcohol use.  Patient was referred to [facility] by [facility].  He was admitted to the facility based crisis unit [facility]).  Patient was seen face to face today by this provider, chart reviewed and case consulted with Dr.Massengill on 07/11/21.  Per chart review patient has a psychiatric history of depression and has been prescribed Celexa 20 mg daily by his out patient provider at [facility].  On admission patient was consuming 1/2 pint of vodka and 3-4 beers daily.  Reports alcohol use upon awakening and continued use through out the day.  He denies any history of seizures from alcohol withdrawal delirium tremors.  On admission UDS was positive for THC and BAL 66.  Patient is currently homeless and unemployed.  On evaluation Allen Cooke is in sitting position in no acute distress.  He is alert/oriented x4 and cooperative.  He makes good eye contact.  His speech is clear, coherent, normal rate and tone.  He continues to endorse depression and anxiety.  However he reports his symptoms have improved since his admission.  He denies any concerns with appetite and reports 4-5 hours of sleep per night. He is attending/participating in group sessions. He continues to deny suicidal/homicidal/self-harm ideation, delusional thought and paranoia.  He denies AVH. Reassurance, support, and encouragement provided.   Patient continues to tolerate medications including Ativan taper without any adverse reactions.  Reports the Ativan makes him a little groggy throughout the day.  He denies any alcohol withdrawal symptoms. Patient's last CIWA score was 0. He is reporting sinus congestion and drip. Denies cough. Lungs are clear  bilaterally. Will order Ocean Nasal Spray daily PRN and Claritin 10 mg daily PRN.   Patient has been accepted into [facility] residential treatment for Monday 07/13/2021.  Diagnosis:  Final diagnoses:  MDD (major depressive disorder), recurrent severe, without psychosis ([place])  Alcohol use disorder, severe, dependence ([place])    Total Time spent with patient: 30 minutes  Past Psychiatric History: See H&P Past Medical History:  Past Medical History:  Diagnosis Date   Anxiety    Meningitis    as a teenager    Past Surgical History:  Procedure Laterality Date   ANTERIOR CRUCIATE LIGAMENT REPAIR Right 1985   Family History:  Family History  Problem Relation Age of Onset   Migraines Mother    Coronary aneurysm Mother    Family Psychiatric  History: See H&P Social History:  Social History   Substance and Sexual Activity  Alcohol Use Yes   Comment: occ     Social History   Substance and Sexual Activity  Drug Use Never    Social History   Socioeconomic History   Marital status: Single    Spouse name: Not on file   Number of children: Not on file   Years of education: Not on file   Highest education level: Not on file  Occupational History   Not on file  Tobacco Use   Smoking status: Never   Smokeless tobacco: Never  Substance and Sexual Activity   Alcohol use: Yes    Comment: occ   Drug use: Never   Sexual activity: Not on file  Other Topics Concern   Not on file  Social History Narrative  Right handed   Social Determinants of Health   Financial Resource Strain: Not on file  Food Insecurity: Not on file  Transportation Needs: Not on file  Physical Activity: Not on file  Stress: Not on file  Social Connections: Not on file   SDOH:  SDOH Screenings   Alcohol Screen: Not on file  Depression (PHQ2-9): Not on file  Financial Resource Strain: Not on file  Food Insecurity: Not on file  Housing: Not on file  Physical Activity: Not on file  Social  Connections: Not on file  Stress: Not on file  Tobacco Use: Low Risk    Smoking Tobacco Use: Never   Smokeless Tobacco Use: Never   Passive Exposure: Not on file  Transportation Needs: Not on file   Additional Social History:    Pain Medications: None Prescriptions: Celexa, a BP med; a lasics; an arthritis medicine.  A prostate med. Over the Counter: None History of alcohol / drug use?: Yes Longest period of sobriety (when/how long): About 90-120 days in 2021. Negative Consequences of Use: Personal relationships, Work / Youth worker, Museum/gallery curator Withdrawal Symptoms: Patient aware of relationship between substance abuse and physical/medical complications, Tremors, Weakness, Agitation, Sweats, Fever / Chills, Nausea / Vomiting, Diarrhea, Cramps Name of Substance 1: ETOH (vodka) 1 - Age of First Use: 61 years of age 80 - Amount (size/oz): 1 pint up to a 5th per day 1 - Frequency: Daily use 1 - Duration: Last 5 years at that rate 1 - Last Use / Amount: 02/09 he drank a 5th PTA 1 - Method of Aquiring: purchase 1- Route of Use: oral.                  Sleep: Fair  Appetite:  Good  Current Medications:  Current Facility-Administered Medications  Medication Dose Route Frequency Provider Last Rate Last Admin   alum & mag hydroxide-simeth (MAALOX/MYLANTA) 200-200-20 MG/5ML suspension 30 mL  30 mL Oral Q4H PRN Lovena Le, Cody W, PA-C       amLODipine (NORVASC) tablet 10 mg  10 mg Oral Daily [name] W, PA-C   10 mg at 07/11/21 0958   citalopram (CELEXA) tablet 20 mg  20 mg Oral Daily [name] W, PA-C   20 mg at 07/11/21 0959   hydrochlorothiazide (HYDRODIURIL) tablet 25 mg  25 mg Oral Daily [name] W, PA-C   25 mg at 07/11/21 [id number]   hydrOXYzine (ATARAX) tablet 25 mg  25 mg Oral Q6H PRN [name] W, PA-C       loperamide (IMODIUM) capsule 2-4 mg  2-4 mg Oral PRN [name] W, PA-C       LORazepam (ATIVAN) tablet 1 mg  1 mg Oral Q6H PRN Prescilla Sours, PA-C   1 mg at 07/11/21  [id number]   LORazepam (ATIVAN) tablet 1 mg  1 mg Oral TID Prescilla Sours, PA-C       Followed by   Derrill Memo ON 07/12/2021] LORazepam (ATIVAN) tablet 1 mg  1 mg Oral BID Prescilla Sours, PA-C       Followed by   Derrill Memo ON 07/13/2021] LORazepam (ATIVAN) tablet 1 mg  1 mg Oral Daily Lovena Le, Cody W, PA-C       magnesium hydroxide (MILK OF MAGNESIA) suspension 30 mL  30 mL Oral Daily PRN [name] W, PA-C       meloxicam (MOBIC) tablet 15 mg  15 mg Oral Daily PRN [name] W, PA-C   15 mg at 07/10/21 1050  multivitamin with minerals tablet 1 tablet  1 tablet Oral Daily Prescilla Sours, PA-C   1 tablet at 07/11/21 0957   ondansetron (ZOFRAN-ODT) disintegrating tablet 4 mg  4 mg Oral Q6H PRN Prescilla Sours, PA-C       tamsulosin [facility]) capsule 0.4 mg  0.4 mg Oral QHS [name] W, PA-C   0.4 mg at 07/10/21 2131   thiamine tablet 100 mg  100 mg Oral Daily Prescilla Sours, PA-C   100 mg at 07/11/21 [id number]   traZODone (DESYREL) tablet 50 mg  50 mg Oral QHS PRN Prescilla Sours, PA-C       Current Outpatient Medications  Medication Sig Dispense Refill   amLODipine (NORVASC) 10 MG tablet Take 10 mg by mouth daily.     CELEXA 20 MG tablet Take 20 mg by mouth daily.     FLOMAX 0.4 MG CAPS capsule Take 0.4 mg by mouth at bedtime.     hydrochlorothiazide (HYDRODIURIL) 25 MG tablet Take 25 mg by mouth daily.     meloxicam (MOBIC) 15 MG tablet Take 15 mg by mouth daily as needed.      Labs  Lab Results:  Admission on 07/09/2021  Component Date Value Ref Range Status   SARS Coronavirus 2 by RT PCR 07/10/2021 NEGATIVE  NEGATIVE Final   Comment: (NOTE) SARS-CoV-2 target nucleic acids are NOT DETECTED.  The SARS-CoV-2 RNA is generally detectable in upper respiratory specimens during the acute phase of infection. The lowest concentration of SARS-CoV-2 viral copies this assay can detect is 138 copies/mL. A negative result does not preclude SARS-Cov-2 infection and should not be used as the sole basis for  treatment or other patient management decisions. A negative result may occur with  improper specimen collection/handling, submission of specimen other than nasopharyngeal swab, presence of viral mutation(s) within the areas targeted by this assay, and inadequate number of viral copies(<138 copies/mL). A negative result must be combined with clinical observations, patient history, and epidemiological information. The expected result is Negative.  Fact Sheet for Patients:  EntrepreneurPulse.com.au  Fact Sheet for Healthcare Providers:  IncredibleEmployment.be  This test is no                          t yet approved or cleared by the [place] FDA and  has been authorized for detection and/or diagnosis of SARS-CoV-2 by FDA under an Emergency Use Authorization (EUA). This EUA will remain  in effect (meaning this test can be used) for the duration of the COVID-19 declaration under Section 564(b)(1) of the Act, 21 U.S.C.section 360bbb-3(b)(1), unless the authorization is terminated  or revoked sooner.       Influenza A by PCR 07/10/2021 NEGATIVE  NEGATIVE Final   Influenza B by PCR 07/10/2021 NEGATIVE  NEGATIVE Final   Comment: (NOTE) The Xpert Xpress SARS-CoV-2/FLU/RSV plus assay is intended as an aid in the diagnosis of influenza from Nasopharyngeal swab specimens and should not be used as a sole basis for treatment. Nasal washings and aspirates are unacceptable for Xpert Xpress SARS-CoV-2/FLU/RSV testing.  Fact Sheet for Patients: EntrepreneurPulse.com.au  Fact Sheet for Healthcare Providers: IncredibleEmployment.be  This test is not yet approved or cleared by the [place] FDA and has been authorized for detection and/or diagnosis of SARS-CoV-2 by FDA under an Emergency Use Authorization (EUA). This EUA will remain in effect (meaning this test can be used) for the duration of the COVID-19  declaration under Section  564(b)(1) of the Act, 21 U.S.C. section 360bbb-3(b)(1), unless the authorization is terminated or revoked.  Performed at [facility], [address], [place], [place] [address]    SARS Coronavirus 2 Ag 07/10/2021 Negative  Negative Preliminary   WBC 07/10/2021 5.4  4.0 - 10.5 K/uL Final   RBC 07/10/2021 5.18  4.22 - 5.81 MIL/uL Final   Hemoglobin 07/10/2021 15.7  13.0 - 17.0 g/dL Final   HCT 91/63/8466 46.3  39.0 - 52.0 % Final   MCV 07/10/2021 89.4  80.0 - 100.0 fL Final   MCH 07/10/2021 30.3  26.0 - 34.0 pg Final   MCHC 07/10/2021 33.9  30.0 - 36.0 g/dL Final   RDW 59/93/5701 14.1  11.5 - 15.5 % Final   Platelets 07/10/2021 222  150 - 400 K/uL Final   nRBC 07/10/2021 0.0  0.0 - 0.2 % Final   Neutrophils Relative % 07/10/2021 54  % Final   Neutro Abs 07/10/2021 2.9  1.7 - 7.7 K/uL Final   Lymphocytes Relative 07/10/2021 35  % Final   Lymphs Abs 07/10/2021 1.9  0.7 - 4.0 K/uL Final   Monocytes Relative 07/10/2021 7  % Final   Monocytes Absolute 07/10/2021 0.4  0.1 - 1.0 K/uL Final   Eosinophils Relative 07/10/2021 3  % Final   Eosinophils Absolute 07/10/2021 0.2  0.0 - 0.5 K/uL Final   Basophils Relative 07/10/2021 1  % Final   Basophils Absolute 07/10/2021 0.1  0.0 - 0.1 K/uL Final   Immature Granulocytes 07/10/2021 0  % Final   Abs Immature Granulocytes 07/10/2021 0.02  0.00 - 0.07 K/uL Final   Performed at [facility], [address] [place], [place] [address]   Sodium 07/10/2021 136  135 - 145 mmol/L Final   Potassium 07/10/2021 3.7  3.5 - 5.1 mmol/L Final   Chloride 07/10/2021 101  98 - 111 mmol/L Final   CO2 07/10/2021 21 (L)  22 - 32 mmol/L Final   Glucose, Bld 07/10/2021 107 (H)  70 - 99 mg/dL Final   Glucose reference range applies only to samples taken after fasting for at least 8 hours.   BUN 07/10/2021 20  8 - 23 mg/dL Final   Creatinine, Ser 07/10/2021 0.88  0.61 - 1.24 mg/dL Final   Calcium 03/00/9233 9.3  8.9 - 10.3 mg/dL  Final   Total Protein 07/10/2021 6.8  6.5 - 8.1 g/dL Final   Albumin 00/76/2263 4.1  3.5 - 5.0 g/dL Final   AST 33/54/5625 29  15 - 41 U/L Final   ALT 07/10/2021 56 (H)  0 - 44 U/L Final   Alkaline Phosphatase 07/10/2021 50  38 - 126 U/L Final   Total Bilirubin 07/10/2021 0.9  0.3 - 1.2 mg/dL Final   GFR, Estimated 07/10/2021 >60  >60 mL/min Final   Comment: (NOTE) Calculated using the CKD-EPI Creatinine Equation (2021)    Anion gap 07/10/2021 14  5 - 15 Final   Performed at [facility], [address], [place], [place] [address]   Hgb A1c MFr Bld 07/10/2021 5.4  4.8 - 5.6 % Final   Comment: (NOTE) Pre diabetes:          5.7%-6.4%  Diabetes:              >6.4%  Glycemic control for   <7.0% adults with diabetes    Mean Plasma Glucose 07/10/2021 108.28  mg/dL Final   Performed at [facility], [address] [place], [place] [address]   Alcohol, Ethyl (B) 07/10/2021 66 (H)  <10 mg/dL Final   Comment: (NOTE) Lowest detectable limit for serum alcohol is 10 mg/dL.  For medical purposes only. Performed at [facility], [place] [address], [place], [place] [address]    TSH 07/10/2021 1.962  0.350 - 4.500 uIU/mL Final   Comment: Performed by a 3rd Generation assay with a functional sensitivity of <=0.01 uIU/mL. Performed at [facility], [place] [address], [place], [place] [address]    POC Amphetamine UR 07/10/2021 None Detected  NONE DETECTED (Cut Off Level 1000 ng/mL) Final   POC Secobarbital (BAR) 07/10/2021 None Detected  NONE DETECTED (Cut Off Level 300 ng/mL) Final   POC Buprenorphine (BUP) 07/10/2021 None Detected  NONE DETECTED (Cut Off Level 10 ng/mL) Final   POC Oxazepam (BZO) 07/10/2021 None Detected  NONE DETECTED (Cut Off Level 300 ng/mL) Final   POC Cocaine UR 07/10/2021 None Detected  NONE DETECTED (Cut Off Level 300 ng/mL) Final   POC Methamphetamine UR 07/10/2021 None Detected  NONE DETECTED (Cut Off Level 1000 ng/mL) Final   POC Morphine  07/10/2021 None Detected  NONE DETECTED (Cut Off Level 300 ng/mL) Final   POC Oxycodone UR 07/10/2021 None Detected  NONE DETECTED (Cut Off Level 100 ng/mL) Final   POC Methadone UR 07/10/2021 None Detected  NONE DETECTED (Cut Off Level 300 ng/mL) Final   POC Marijuana UR 07/10/2021 Positive (A)  NONE DETECTED (Cut Off Level 50 ng/mL) Final   Cholesterol 07/10/2021 215 (H)  0 - 200 mg/dL Final   Triglycerides 07/10/2021 167 (H)  <150 mg/dL Final   HDL 07/10/2021 56  >40 mg/dL Final   Total CHOL/HDL Ratio 07/10/2021 3.8  RATIO Final   VLDL 07/10/2021 33  0 - 40 mg/dL Final   LDL Cholesterol 07/10/2021 126 (H)  0 - 99 mg/dL Final   Comment:        Total Cholesterol/HDL:CHD Risk Coronary Heart Disease Risk Table                     Men   Women  1/2 Average Risk   3.4   3.3  Average Risk       5.0   4.4  2 X Average Risk   9.6   7.1  3 X Average Risk  23.4   11.0        Use the calculated Patient Ratio above and the CHD Risk Table to determine the patient's CHD Risk.        ATP III CLASSIFICATION (LDL):  <100     mg/dL   Optimal  100-129  mg/dL   Near or Above                    Optimal  130-159  mg/dL   Borderline  160-189  mg/dL   High  >190     mg/dL   Very High Performed at [place] [address] [place], [place] [address]   Admission on 01/16/2021, Discharged on 01/17/2021  Component Date Value Ref Range Status   WBC 01/16/2021 4.8  4.0 - 10.5 K/uL Final   RBC 01/16/2021 5.09  4.22 - 5.81 MIL/uL Final   Hemoglobin 01/16/2021 15.4  13.0 - 17.0 g/dL Final   HCT 01/16/2021 46.7  39.0 - 52.0 % Final   MCV 01/16/2021 91.7  80.0 - 100.0 fL Final   MCH 01/16/2021 30.3  26.0 -  34.0 pg Final   MCHC 01/16/2021 33.0  30.0 - 36.0 g/dL Final   RDW 01/16/2021 14.0  11.5 - 15.5 % Final   Platelets 01/16/2021 202  150 - 400 K/uL Final   nRBC 01/16/2021 0.0  0.0 - 0.2 % Final   Neutrophils Relative % 01/16/2021 57  % Final   Neutro Abs 01/16/2021 2.7  1.7 - 7.7 K/uL Final    Lymphocytes Relative 01/16/2021 31  % Final   Lymphs Abs 01/16/2021 1.5  0.7 - 4.0 K/uL Final   Monocytes Relative 01/16/2021 8  % Final   Monocytes Absolute 01/16/2021 0.4  0.1 - 1.0 K/uL Final   Eosinophils Relative 01/16/2021 3  % Final   Eosinophils Absolute 01/16/2021 0.2  0.0 - 0.5 K/uL Final   Basophils Relative 01/16/2021 1  % Final   Basophils Absolute 01/16/2021 0.1  0.0 - 0.1 K/uL Final   Immature Granulocytes 01/16/2021 0  % Final   Abs Immature Granulocytes 01/16/2021 0.01  0.00 - 0.07 K/uL Final   Performed at [facility], [place] [address], [place], [place] [address]   Sodium 01/16/2021 140  135 - 145 mmol/L Final   Potassium 01/16/2021 4.0  3.5 - 5.1 mmol/L Final   Chloride 01/16/2021 106  98 - 111 mmol/L Final   BUN 01/16/2021 10  6 - 20 mg/dL Final   Creatinine, Ser 01/16/2021 0.80  0.61 - 1.24 mg/dL Final   Glucose, Bld 01/16/2021 120 (H)  70 - 99 mg/dL Final   Glucose reference range applies only to samples taken after fasting for at least 8 hours.   Calcium, Ion 01/16/2021 1.16  1.15 - 1.40 mmol/L Final   TCO2 01/16/2021 23  22 - 32 mmol/L Final   Hemoglobin 01/16/2021 15.6  13.0 - 17.0 g/dL Final   HCT 01/16/2021 46.0  39.0 - 52.0 % Final   Sodium 01/16/2021 134 (L)  135 - 145 mmol/L Final   Potassium 01/16/2021 3.9  3.5 - 5.1 mmol/L Final   Chloride 01/16/2021 103  98 - 111 mmol/L Final   CO2 01/16/2021 19 (L)  22 - 32 mmol/L Final   Glucose, Bld 01/16/2021 117 (H)  70 - 99 mg/dL Final   Glucose reference range applies only to samples taken after fasting for at least 8 hours.   BUN 01/16/2021 10  6 - 20 mg/dL Final   Creatinine, Ser 01/16/2021 0.89  0.61 - 1.24 mg/dL Final   Calcium 01/16/2021 8.9  8.9 - 10.3 mg/dL Final   Total Protein 01/16/2021 6.8  6.5 - 8.1 g/dL Final   Albumin 01/16/2021 3.8  3.5 - 5.0 g/dL Final   AST 01/16/2021 29  15 - 41 U/L Final   ALT 01/16/2021 55 (H)  0 - 44 U/L Final   Alkaline Phosphatase 01/16/2021 55  38 - 126 U/L Final    Total Bilirubin 01/16/2021 1.0  0.3 - 1.2 mg/dL Final   GFR, Estimated 01/16/2021 >60  >60 mL/min Final   Comment: (NOTE) Calculated using the CKD-EPI Creatinine Equation (2021)    Anion gap 01/16/2021 12  5 - 15 Final   Performed at [place] [address] [place], [place] [address]   Prothrombin Time 01/16/2021 12.4  11.4 - 15.2 seconds Final   INR 01/16/2021 0.9  0.8 - 1.2 Final   Comment: (NOTE) INR goal varies based on device and disease states. Performed at [facility], [place] [address], [place], [place] [address]  Blood Alcohol level:  Lab Results  Component Value Date   ETH 66 (H) 0000000    Metabolic Disorder Labs: Lab Results  Component Value Date   HGBA1C 5.4 07/10/2021   MPG 108.28 07/10/2021   No results found for: PROLACTIN Lab Results  Component Value Date   CHOL 215 (H) 07/10/2021   TRIG 167 (H) 07/10/2021   HDL 56 07/10/2021   CHOLHDL 3.8 07/10/2021   VLDL 33 07/10/2021   LDLCALC 126 (H) 07/10/2021    Therapeutic Lab Levels: No results found for: LITHIUM No results found for: VALPROATE No components found for:  CBMZ  Physical Findings   Flowsheet Row ED from 07/09/2021 in [facility] ED from 01/16/2021 in [facility] No Risk No Risk        Musculoskeletal  Strength & Muscle Tone: within normal limits Gait & Station: normal Patient leans: N/A  Psychiatric Specialty Exam  Presentation  General Appearance: Appropriate for Environment  Eye Contact:Good  Speech:Clear and Coherent; Normal Rate  Speech Volume:Normal  Handedness:Right   Mood and Affect  Mood:Anxious; Depressed  Affect:Congruent   Thought Process  Thought Processes:Coherent  Descriptions of Associations:Intact  Orientation:Full (Time, Place and Person)  Thought Content:Logical  Diagnosis of Schizophrenia or Schizoaffective disorder in past: No     Hallucinations:Hallucinations: None  Ideas of Reference:None  Suicidal Thoughts:Suicidal Thoughts: No  Homicidal Thoughts:Homicidal Thoughts: No   Sensorium  Memory:Immediate Good; Recent Good; Remote Good  Judgment:Good  Insight:Good   Executive Functions  Concentration:Good  Attention Span:Good  [place] of Knowledge:Good  Language:Good   Psychomotor Activity  Psychomotor Activity:Psychomotor Activity: Normal   Assets  Assets:Communication Skills; Desire for Improvement; Resilience; Physical Health   Sleep  Sleep:Sleep: Poor Number of Hours of Sleep: 4   Nutritional Assessment (For OBS and FBC admissions only) Has the patient had a weight loss or gain of 10 pounds or more in the last 3 months?: No Has the patient had a decrease in food intake/or appetite?: Yes (Patient endorses fluctuating appetite.) Does the patient have dental problems?: No Does the patient have eating habits or behaviors that may be indicators of an eating disorder including binging or inducing vomiting?: No Has the patient recently lost weight without trying?: 0 Has the patient been eating poorly because of a decreased appetite?: 1 (Patient endorses fluctuating appetite.) Malnutrition Screening Tool Score: 1    Physical Exam  Physical Exam Vitals and nursing note reviewed.  Constitutional:      General: He is not in acute distress.    Appearance: He is well-developed.  HENT:     Head: Normocephalic and atraumatic.  Eyes:     General:        Right eye: No discharge.        Left eye: No discharge.     Conjunctiva/sclera: Conjunctivae normal.  Cardiovascular:     Rate and Rhythm: Normal rate.  Pulmonary:     Effort: Pulmonary effort is normal. No respiratory distress.  Musculoskeletal:        General: Normal range of motion.     Cervical back: Normal range of motion.  Skin:    Coloration: Skin is not jaundiced or pale.  Neurological:     Mental Status: He is  alert and oriented to person, place, and time.  Psychiatric:        Attention and Perception: Attention and perception normal.  Mood and Affect: Mood is anxious and depressed.        Speech: Speech normal.        Behavior: Behavior normal. Behavior is cooperative.        Thought Content: Thought content normal.        Cognition and Memory: Cognition normal.        Judgment: Judgment normal.   Review of Systems  Constitutional: Negative.   HENT: Negative.    Eyes: Negative.   Respiratory: Negative.    Cardiovascular: Negative.   Musculoskeletal: Negative.   Skin: Negative.   Neurological: Negative.   Psychiatric/Behavioral:  Positive for depression. The patient is nervous/anxious.   Blood pressure (!) 155/98, pulse 73, temperature (!) 97.5 F (36.4 C), temperature source Oral, resp. rate 18, SpO2 94 %. There is no height or weight on file to calculate BMI.  Treatment Plan Summary: Daily contact with patient to assess and evaluate symptoms and progress in treatment and Medication management  Disposition: Ongoing.  Patient has been accepted to [facility] residential treatment for Monday 07/13/2021.  We will continue to monitor patient daily Patient will need 2-week sample of medications including 1 month prescription.  Will order Ocean Nasal Spray daily PRN and Claritin 10 mg daily PRN for sinus congestion, will avoid Sudafed, patient has HTN and is on BP medications.   Revonda Humphrey, NP 07/11/2021 10:45 AM

## 2021-07-11 NOTE — ED Notes (Signed)
Appears to be resting quietly at this time , eyes closed , respirations even and unlabored , no distress noted . Will continue to monitor for safety  °

## 2021-07-11 NOTE — ED Notes (Signed)
Patient ate dinner and took his evening medications as prescribed. Patient is resting and has been calm and cooperative the entire shift. Patient will continuously be monitored for safety.

## 2021-07-11 NOTE — ED Notes (Signed)
Pt came to group and he participated, he was give snack and lunch

## 2021-07-11 NOTE — ED Notes (Signed)
Pt was given dinner x2 and he went to bed

## 2021-07-12 MED ORDER — TRAZODONE HCL 50 MG PO TABS: 50.0000 mg | ORAL_TABLET | Freq: Every evening | ORAL | 1 refills | Status: DC | PRN

## 2021-07-12 MED ORDER — FLOMAX 0.4 MG PO CAPS: 0.4000 mg | ORAL_CAPSULE | Freq: Every day | ORAL | 1 refills | Status: DC

## 2021-07-12 MED ORDER — MELOXICAM 15 MG PO TABS: 15.0000 mg | ORAL_TABLET | Freq: Every day | ORAL | 1 refills | Status: DC

## 2021-07-12 MED ORDER — TAMSULOSIN HCL 0.4 MG PO CAPS: 0.4000 mg | ORAL_CAPSULE | Freq: Every day | ORAL | 0 refills | Status: DC

## 2021-07-12 MED ORDER — HYDROCHLOROTHIAZIDE 12.5 MG PO TABS: 12.5000 mg | ORAL_TABLET | Freq: Every day | ORAL | 1 refills | Status: DC

## 2021-07-12 MED ORDER — HYDROXYZINE PAMOATE 25 MG PO CAPS: 25.0000 mg | ORAL_CAPSULE | Freq: Three times a day (TID) | ORAL | 0 refills | Status: DC | PRN

## 2021-07-12 MED ORDER — LORATADINE 10 MG PO TABS: 10.0000 mg | ORAL_TABLET | Freq: Every day | ORAL | 0 refills | Status: DC | PRN

## 2021-07-12 MED ORDER — TAMSULOSIN HCL 0.4 MG PO CAPS: 0.4000 mg | ORAL_CAPSULE | Freq: Every day | ORAL | 1 refills | Status: DC

## 2021-07-12 MED ORDER — HYDROCHLOROTHIAZIDE 25 MG PO TABS: 25.0000 mg | ORAL_TABLET | Freq: Every day | ORAL | 1 refills | Status: DC

## 2021-07-12 MED ORDER — HYDROXYZINE HCL 25 MG PO TABS
25.0000 mg | ORAL_TABLET | Freq: Three times a day (TID) | ORAL | 1 refills | Status: DC | PRN
Start: 2021-07-12 — End: 2021-07-12

## 2021-07-12 MED ORDER — SALINE SPRAY 0.65 % NA SOLN: 1.0000 | Freq: Four times a day (QID) | NASAL | 0 refills | Status: AC | PRN

## 2021-07-12 MED ORDER — CELEXA 20 MG PO TABS: 20.0000 mg | ORAL_TABLET | Freq: Every day | ORAL | 1 refills | Status: DC

## 2021-07-12 MED ORDER — HYDROXYZINE PAMOATE 25 MG PO CAPS
25.0000 mg | ORAL_CAPSULE | Freq: Three times a day (TID) | ORAL | 0 refills | Status: DC | PRN
Start: 2021-07-12 — End: 2021-08-03

## 2021-07-12 MED ORDER — AMLODIPINE BESYLATE 10 MG PO TABS
10.0000 mg | ORAL_TABLET | Freq: Every day | ORAL | 0 refills | Status: DC
Start: 2021-07-12 — End: 2021-07-12

## 2021-07-12 MED ORDER — TRAZODONE HCL 50 MG PO TABS: 50.0000 mg | ORAL_TABLET | Freq: Every day | ORAL | 0 refills | Status: DC

## 2021-07-12 MED ORDER — AMLODIPINE BESYLATE 10 MG PO TABS: 10.0000 mg | ORAL_TABLET | Freq: Every day | ORAL | 1 refills | Status: DC

## 2021-07-12 MED ORDER — CITALOPRAM HYDROBROMIDE 20 MG PO TABS: 20.0000 mg | ORAL_TABLET | Freq: Every day | ORAL | 0 refills | Status: DC

## 2021-07-12 MED ORDER — CITALOPRAM HYDROBROMIDE 20 MG PO TABS
20.0000 mg | ORAL_TABLET | Freq: Every day | ORAL | 1 refills | Status: DC
Start: 2021-07-12 — End: 2021-08-03

## 2021-07-12 MED ORDER — HYDROCHLOROTHIAZIDE 25 MG PO TABS
12.5000 mg | ORAL_TABLET | Freq: Every day | ORAL | 1 refills | Status: DC
Start: 2021-07-12 — End: 2021-08-03

## 2021-07-12 MED ORDER — HYDROXYZINE HCL 25 MG PO TABS
25.0000 mg | ORAL_TABLET | Freq: Three times a day (TID) | ORAL | Status: DC | PRN
  Filled 2021-07-12 (×2): qty 1

## 2021-07-12 MED ORDER — HYDROCHLOROTHIAZIDE 25 MG PO TABS
25.0000 mg | ORAL_TABLET | Freq: Every day | ORAL | 0 refills | Status: DC
Start: 2021-07-12 — End: 2021-07-12

## 2021-07-12 MED ORDER — MELOXICAM 15 MG PO TABS: 15.0000 mg | ORAL_TABLET | Freq: Every day | ORAL | 1 refills | Status: DC | PRN

## 2021-07-12 MED ORDER — MELOXICAM 15 MG PO TABS: 15.0000 mg | ORAL_TABLET | Freq: Every day | ORAL | 0 refills | Status: DC

## 2021-07-12 NOTE — ED Notes (Signed)
Pt is in the bed sleeping. Respirations are even and unlabored. No acute distress noted. Will continue to monitor for safety. 

## 2021-07-12 NOTE — ED Notes (Signed)
Patient has been participative and cooperative this morning. Patient denies SI/HI and AVH. Patient attended group this morning and took his medications as prescribed. Patient is being monitored for safety.

## 2021-07-12 NOTE — ED Notes (Signed)
Pt sleeping@this  time.breathing even and unlabored. Will continue to monitor for saftey

## 2021-07-12 NOTE — Discharge Instructions (Signed)

## 2021-07-12 NOTE — ED Notes (Signed)
Snack given.

## 2021-07-12 NOTE — ED Provider Notes (Addendum)
FBC/OBS ASAP Discharge Summary  Date and Time: 07/12/2021 3:10 PM  Name: Allen Cooke  MRN:  [id number]   Discharge Diagnoses:  Final diagnoses:  MDD (major depressive disorder), recurrent severe, without psychosis ([place])  Alcohol use disorder, severe, dependence ([place])    Subjective: Allen Cooke, 61 y.o., male patient who initially presented to [facility] on 07/09/2021 due to increased depression and requesting substance abuse treatment related to alcohol use.  Patient was referred to [facility] by [facility].  He was admitted to the facility based crisis unit [facility]).  Patient was seen face to face today by this provider, chart reviewed and case consulted with Dr.Massengill on 07/12/21.  Per chart review patient has a psychiatric history of depression and has been prescribed Celexa 20 mg daily by his out patient provider at [facility].  On admission patient was consuming 1/2 pint of vodka and 3-4 beers daily. Reports alcohol use upon awakening and continued use through out the day.  He denies any history of seizures from alcohol withdrawal delirium tremors.  On admission UDS was positive for THC and BAL 66. Patient is currently homeless and unemployed.  On today's reevaluation. He is alert/oriented and cooperative.  He is speaking in a moderate rate and tone.  He has good eye contact.  He continues to endorse depression and anxiety but states, "I think the medicines are helping".  Reports eating and sleeping without difficulty.  He is tolerating medications without any adverse reactions.  He continues to deny SI/HI/AVH.  He denies any alcohol withdrawal symptoms.  His CIWA continues to score at 0.   Stay Summary:  Patient remained calm and cooperative while on the unit.  He interacted with staff and other patients appropriately. He exhibited no unsafe behaviors while on the unit.  He has been accepted to [facility] [place] [facility] treatment and will be transported via safe transport for a 9 AM appointment  on 07/13/2021.  Patient will be provided 2 weeks sample of colon Norvasc 10 mg daily, Celexa 20 mg daily, hydrochlorothiazide 25 mg daily, hydroxyzine 25 mg every 6 hours as needed, Claritin 10 mg as needed, meloxicam 15 mg daily as needed for knee pain, sodium chloride Ocean nasal spray, Flomax 0.4 mg Qd, trazodone 50 mg QHS prn. Prescriptions will be provided with one month refill- per [facility] requirement.   Upon completion of this admission the Allen Cooke was both mentally and medically stable for discharge denying suicidal/homicidal ideation, auditory/visual/tactile hallucinations, delusional thoughts and paranoia.     Total Time spent with patient: 30 minutes  Past Psychiatric History: see h&p Past Medical History:  Past Medical History:  Diagnosis Date   Anxiety    Meningitis    as a teenager    Past Surgical History:  Procedure Laterality Date   ANTERIOR CRUCIATE LIGAMENT REPAIR Right 1985   Family History:  Family History  Problem Relation Age of Onset   Migraines Mother    Coronary aneurysm Mother    Family Psychiatric History: see h&p Social History:  Social History   Substance and Sexual Activity  Alcohol Use Yes   Comment: occ     Social History   Substance and Sexual Activity  Drug Use Never    Social History   Socioeconomic History   Marital status: Single    Spouse name: Not on file   Number of children: Not on file   Years of education: Not on file   Highest education level: Not on file  Occupational History   Not on file  Tobacco Use   Smoking status: Never   Smokeless tobacco: Never  Substance and Sexual Activity   Alcohol use: Yes    Comment: occ   Drug use: Never   Sexual activity: Not on file  Other Topics Concern   Not on file  Social History Narrative   Right handed   Social Determinants of Health   Financial Resource Strain: Not on file  Food Insecurity: Not on file  Transportation Needs: Not on file  Physical  Activity: Not on file  Stress: Not on file  Social Connections: Not on file   SDOH:  SDOH Screenings   Alcohol Screen: Not on file  Depression (PHQ2-9): Not on file  Financial Resource Strain: Not on file  Food Insecurity: Not on file  Housing: Not on file  Physical Activity: Not on file  Social Connections: Not on file  Stress: Not on file  Tobacco Use: Low Risk    Smoking Tobacco Use: Never   Smokeless Tobacco Use: Never   Passive Exposure: Not on file  Transportation Needs: Not on file    Tobacco Cessation:  N/A, patient does not currently use tobacco products  Current Medications:  Current Facility-Administered Medications  Medication Dose Route Frequency Provider Last Rate Last Admin   alum & mag hydroxide-simeth (MAALOX/MYLANTA) 200-200-20 MG/5ML suspension 30 mL  30 mL Oral Q4H PRN Ladona Ridgel, Cody W, PA-C       amLODipine (NORVASC) tablet 10 mg  10 mg Oral Daily Melbourne Abts W, PA-C   10 mg at 07/12/21 1012   citalopram (CELEXA) tablet 20 mg  20 mg Oral Daily Melbourne Abts W, PA-C   20 mg at 07/12/21 1012   hydrochlorothiazide (HYDRODIURIL) tablet 25 mg  25 mg Oral Daily Melbourne Abts W, PA-C   25 mg at 07/12/21 1011   hydrOXYzine (ATARAX) tablet 25 mg  25 mg Oral Q6H PRN Melbourne Abts W, PA-C       loperamide (IMODIUM) capsule 2-4 mg  2-4 mg Oral PRN Melbourne Abts W, PA-C       loratadine (CLARITIN) tablet 10 mg  10 mg Oral Daily PRN Ardis Hughs, NP   10 mg at 07/11/21 2135   LORazepam (ATIVAN) tablet 1 mg  1 mg Oral Q6H PRN Jaclyn Shaggy, PA-C   1 mg at 07/11/21 9163   LORazepam (ATIVAN) tablet 1 mg  1 mg Oral BID Melbourne Abts W, PA-C   1 mg at 07/12/21 1012   Followed by   Melene Muller ON 07/13/2021] LORazepam (ATIVAN) tablet 1 mg  1 mg Oral Daily Ladona Ridgel, Cody W, PA-C       magnesium hydroxide (MILK OF MAGNESIA) suspension 30 mL  30 mL Oral Daily PRN Ladona Ridgel, Cody W, PA-C       meloxicam (MOBIC) tablet 15 mg  15 mg Oral Daily PRN Melbourne Abts W, PA-C   15 mg at 07/10/21 1050    multivitamin with minerals tablet 1 tablet  1 tablet Oral Daily Melbourne Abts W, PA-C   1 tablet at 07/12/21 1012   ondansetron (ZOFRAN-ODT) disintegrating tablet 4 mg  4 mg Oral Q6H PRN Melbourne Abts W, PA-C       sodium chloride (OCEAN) 0.65 % nasal spray 1 spray  1 spray Each Nare QID PRN Ardis Hughs, NP   1 spray at 07/11/21 2135   tamsulosin (FLOMAX) capsule 0.4 mg  0.4 mg Oral QHS Jaclyn Shaggy, PA-C  0.4 mg at 07/11/21 2135   thiamine tablet 100 mg  100 mg Oral Daily Jaclyn Shaggy, PA-C   100 mg at 07/12/21 1012   traZODone (DESYREL) tablet 50 mg  50 mg Oral QHS PRN Jaclyn Shaggy, PA-C   50 mg at 07/11/21 2135   Current Outpatient Medications  Medication Sig Dispense Refill   amLODipine (NORVASC) 10 MG tablet Take 1 tablet (10 mg total) by mouth daily. 30 tablet 1   citalopram (CELEXA) 20 MG tablet Take 1 tablet (20 mg total) by mouth daily. 30 tablet 1   hydrOXYzine (VISTARIL) 25 MG capsule Take 1 capsule (25 mg total) by mouth 3 (three) times daily as needed for anxiety. 42 capsule 0   meloxicam (MOBIC) 15 MG tablet Take 1 tablet (15 mg total) by mouth daily. 30 tablet 1   tamsulosin (FLOMAX) 0.4 MG CAPS capsule Take 1 capsule (0.4 mg total) by mouth at bedtime. 30 capsule 1   hydrochlorothiazide (HYDRODIURIL) 25 MG tablet Take 0.5 tablets (12.5 mg total) by mouth daily. 30 tablet 1   loratadine (CLARITIN) 10 MG tablet Take 1 tablet (10 mg total) by mouth daily as needed for allergies (x 14 days then stop). 14 tablet 0   sodium chloride (OCEAN) 0.65 % SOLN nasal spray Place 1 spray into both nostrils 4 (four) times daily as needed for congestion. 1 mL 0   traZODone (DESYREL) 50 MG tablet Take 1 tablet (50 mg total) by mouth at bedtime as needed for sleep. 30 tablet 1    PTA Medications: (Not in a hospital admission)   Musculoskeletal  Strength & Muscle Tone: within normal limits Gait & Station: normal Patient leans: N/A  Psychiatric Specialty Exam  Presentation   General Appearance: Appropriate for Environment; Casual  Eye Contact:Good  Speech:Clear and Coherent; Normal Rate  Speech Volume:Normal  Handedness:Right   Mood and Affect  Mood:Depressed; Anxious  Affect:Congruent   Thought Process  Thought Processes:Coherent  Descriptions of Associations:Intact  Orientation:Full (Time, Place and Person)  Thought Content:Logical  Diagnosis of Schizophrenia or Schizoaffective disorder in past: No    Hallucinations:Hallucinations: None  Ideas of Reference:None  Suicidal Thoughts:Suicidal Thoughts: No  Homicidal Thoughts:Homicidal Thoughts: No   Sensorium  Memory:Immediate Good; Recent Good; Remote Good  Judgment:Good  Insight:Good   Executive Functions  Concentration:Good  Attention Span:Good  Recall:Good  Fund of Knowledge:Good  Language:Good   Psychomotor Activity  Psychomotor Activity:Psychomotor Activity: Normal   Assets  Assets:Communication Skills; Desire for Improvement; Physical Health; Leisure Time; Resilience   Sleep  Sleep:Sleep: Fair   No data recorded  Physical Exam  Physical Exam Vitals and nursing note reviewed.  Constitutional:      General: He is not in acute distress.    Appearance: Normal appearance. He is well-developed.  HENT:     Head: Normocephalic and atraumatic.  Eyes:     General:        Right eye: No discharge.        Left eye: No discharge.     Conjunctiva/sclera: Conjunctivae normal.  Cardiovascular:     Rate and Rhythm: Normal rate.  Pulmonary:     Effort: Pulmonary effort is normal. No respiratory distress.  Abdominal:     Tenderness: There is no abdominal tenderness.  Musculoskeletal:        General: Normal range of motion.     Cervical back: Normal range of motion.  Skin:    Coloration: Skin is not jaundiced or pale.  Neurological:  Mental Status: He is alert and oriented to person, place, and time.  Psychiatric:        Attention and Perception:  Attention and perception normal.        Mood and Affect: Mood is anxious and depressed.        Speech: Speech normal.        Behavior: Behavior normal. Behavior is cooperative.        Thought Content: Thought content normal.        Cognition and Memory: Cognition normal.        Judgment: Judgment is impulsive.   Review of Systems  Constitutional: Negative.   HENT: Negative.    Eyes: Negative.   Respiratory: Negative.    Cardiovascular: Negative.   Musculoskeletal: Negative.   Skin: Negative.   Neurological: Negative.   Psychiatric/Behavioral:  Positive for depression. The patient is nervous/anxious.   Blood pressure (!) 143/92, pulse 85, temperature 97.7 F (36.5 C), temperature source Oral, resp. rate 17, SpO2 97 %. There is no height or weight on file to calculate BMI.  Demographic Factors:  Male, Low socioeconomic status, Living alone, and Unemployed  Loss Factors: Financial problems/change in socioeconomic status  Historical Factors: Impulsivity  Risk Reduction Factors:   Positive coping skills or problem solving skills  Continued Clinical Symptoms:  Severe Anxiety and/or Agitation Depression:   Comorbid alcohol abuse/dependence Impulsivity Alcohol/Substance Abuse/Dependencies  Cognitive Features That Contribute To Risk:  None    Suicide Risk:  Minimal: No identifiable suicidal ideation.  Patients presenting with no risk factors but with morbid ruminations; may be classified as minimal risk based on the severity of the depressive symptoms  Plan Of Care/Follow-up recommendations:  Activity:  as tolerated  Diet:  regular  Disposition:   Discharge patient - Patient has been accepted to [facility] [place] [facility] treatment and will be transported via safe transport for a 9 AM appointment on 07/13/2021.  Patient will be provided 2 weeks sample of  Norvasc 10 mg daily, Celexa 20 mg daily, hydrochlorothiazide 25 mg daily, hydroxyzine 25 mg every 6 hours as needed, Claritin 10  mg as needed, meloxicam 15 mg daily as needed for knee pain, sodium chloride Ocean nasal spray, Flomax 0.4 mg Qd, trazodone 50 mg QHS prn. Prescriptions will be provided with one month refill- per [facility] requirement.   Out patient psychiatric resources for medication management and therapy also provided.   Revonda Humphrey, NP 07/12/2021, 3:10 PM

## 2021-07-12 NOTE — ED Notes (Signed)
Pt complained of congestion and requested for his allergy medication.

## 2021-07-12 NOTE — ED Notes (Signed)
Patient attended group the Title was Identifying a Mental Health Crisis, we went over Trigger of that cause crisis gave him a worksheet, Managing Feelings,how to work through them and things to do to avoid crisis, there is a worksheet with it. We discussed copying during a crisis with worksheet, patient was not active in meeting he just listened. He had nothing to say. Asked him to work on the work sheet during his free time and read through the material.

## 2021-07-12 NOTE — ED Notes (Signed)
Patient is scheduled to go to [facility] in the morning. Patient's scripts and a 30 day supply of his medications are ready. Patient has rested throughout the day. Patient denies SI/HI and AVH. Patient is being monitored for safety by staff.

## 2021-07-12 NOTE — ED Notes (Signed)
Patient denies SI,HI,AVH. . Patient is cooperative and interacts well with staff. Respiratory is even and unlabored. No distress noted. Patient  is in the dinning room watching TV  at present. Patient stated no complaints at present. will continue to monitor for safety.  ?

## 2021-07-12 NOTE — ED Notes (Signed)
Patient has been resting today. Patient has been in a good mood. Patient is being monitored for safety.

## 2021-07-13 NOTE — ED Notes (Signed)
Pt is in the bed sleeping. Respirations are even and unlabored. No acute distress noted. Will continue to monitor for safety. 

## 2021-08-03 ENCOUNTER — Ambulatory Visit: Payer: Self-pay | Admitting: Physician Assistant

## 2021-08-03 ENCOUNTER — Other Ambulatory Visit: Payer: Self-pay

## 2021-08-03 VITALS — BP 145/86 | HR 87 | Resp 18 | Ht 70.0 in | Wt 251.0 lb

## 2021-08-03 DIAGNOSIS — R351 Nocturia: Secondary | ICD-10-CM

## 2021-08-03 DIAGNOSIS — M25561 Pain in right knee: Secondary | ICD-10-CM

## 2021-08-03 DIAGNOSIS — R3911 Hesitancy of micturition: Secondary | ICD-10-CM

## 2021-08-03 DIAGNOSIS — I10 Essential (primary) hypertension: Secondary | ICD-10-CM

## 2021-08-03 DIAGNOSIS — R600 Localized edema: Secondary | ICD-10-CM

## 2021-08-03 DIAGNOSIS — F1010 Alcohol abuse, uncomplicated: Secondary | ICD-10-CM

## 2021-08-03 DIAGNOSIS — F1510 Other stimulant abuse, uncomplicated: Secondary | ICD-10-CM

## 2021-08-03 DIAGNOSIS — Z6836 Body mass index (BMI) 36.0-36.9, adult: Secondary | ICD-10-CM

## 2021-08-03 DIAGNOSIS — Z9189 Other specified personal risk factors, not elsewhere classified: Secondary | ICD-10-CM

## 2021-08-03 DIAGNOSIS — Z1159 Encounter for screening for other viral diseases: Secondary | ICD-10-CM

## 2021-08-03 DIAGNOSIS — G8929 Other chronic pain: Secondary | ICD-10-CM

## 2021-08-03 DIAGNOSIS — F332 Major depressive disorder, recurrent severe without psychotic features: Secondary | ICD-10-CM

## 2021-08-03 DIAGNOSIS — Z114 Encounter for screening for human immunodeficiency virus [HIV]: Secondary | ICD-10-CM

## 2021-08-03 DIAGNOSIS — J302 Other seasonal allergic rhinitis: Secondary | ICD-10-CM

## 2021-08-03 DIAGNOSIS — F5104 Psychophysiologic insomnia: Secondary | ICD-10-CM

## 2021-08-03 MED ORDER — HYDROXYZINE PAMOATE 25 MG PO CAPS: 25.0000 mg | ORAL_CAPSULE | Freq: Three times a day (TID) | ORAL | 1 refills | Status: AC | PRN

## 2021-08-03 MED ORDER — HYDROCHLOROTHIAZIDE 50 MG PO TABS: 50.0000 mg | ORAL_TABLET | Freq: Every day | ORAL | 1 refills | Status: AC

## 2021-08-03 MED ORDER — CITALOPRAM HYDROBROMIDE 20 MG PO TABS: 20.0000 mg | ORAL_TABLET | Freq: Every day | ORAL | 1 refills | Status: AC

## 2021-08-03 MED ORDER — TAMSULOSIN HCL 0.4 MG PO CAPS: 0.4000 mg | ORAL_CAPSULE | Freq: Every day | ORAL | 1 refills | Status: AC

## 2021-08-03 MED ORDER — MELOXICAM 15 MG PO TABS: 15.0000 mg | ORAL_TABLET | Freq: Every day | ORAL | 1 refills | Status: AC

## 2021-08-03 MED ORDER — AMLODIPINE BESYLATE 5 MG PO TABS: 5.0000 mg | ORAL_TABLET | Freq: Every day | ORAL | 1 refills | Status: AC

## 2021-08-03 MED ORDER — TRAZODONE HCL 50 MG PO TABS: ORAL_TABLET | ORAL | 1 refills | Status: AC

## 2021-08-03 MED ORDER — LORATADINE 10 MG PO TABS: 10.0000 mg | ORAL_TABLET | Freq: Every day | ORAL | 1 refills | Status: AC

## 2021-08-03 NOTE — Progress Notes (Signed)
Patient has eaten today and patient has taken medication. ?Patient reports bilateral LE edema since beginning BP medications. ?

## 2021-08-03 NOTE — Patient Instructions (Addendum)
You are going to take a reduced dose of amlodipine and an increased dose of hydrochlorothiazide.  I do encourage you to continue checking your blood pressure on a daily basis, keep a written log and have available for all office visits.  It is very important that you follow-up with the mobile unit in 2 weeks for further evaluation. ? ?When you return, please plan on having fasting labs completed. ? ?To help with your sleep you are going to take an increased dose of trazodone, you can take up to 2 tablets at bedtime as needed. ? ?To help with your right knee pain, I do encourage you to increase your water intake, I have also started an order for you to have an x-ray completed. ? ?Roney Jaffe, PA-C ?Physician Assistant ?[facility] ?[email or web address] ?Edema ?Edema is an abnormal buildup of fluids in the body tissues and under the skin. Swelling of the legs, feet, and ankles is a common symptom that becomes more likely as you get older. Swelling is also common in looser tissues, like around the eyes. When the affected area is squeezed, the fluid may move out of that spot and leave a dent for a few moments. This dent is called pitting edema. ?There are many possible causes of edema. Eating too much salt (sodium) and being on your feet or sitting for a long time can cause edema in your legs, feet, and ankles. Hot weather may make edema worse. Common causes of edema include: ?Heart failure. ?Liver or kidney disease. ?Weak leg blood vessels. ?Cancer. ?An injury. ?Pregnancy. ?Medicines. ?Being obese. ?Low protein levels in the blood. ?Edema is usually painless. Your skin may look swollen or shiny. ?Follow these instructions at home: ?Keep the affected body part raised (elevated) above the level of your heart when you are sitting or lying down. ?Do not sit still or stand for long periods of time. ?Do not wear tight clothing. Do not wear garters on your upper  legs. ?Exercise your legs to get your circulation going. This helps to move the fluid back into your blood vessels, and it may help the swelling go down. ?Wear elastic bandages or support stockings to reduce swelling as told by your health care provider. ?Eat a low-salt (low-sodium) diet to reduce fluid as told by your health care provider. ?Depending on the cause of your swelling, you may need to limit how much fluid you drink (fluid restriction). ?Take over-the-counter and prescription medicines only as told by your health care provider. ?Contact a health care provider if: ?Your edema does not get better with treatment. ?You have heart, liver, or kidney disease and have symptoms of edema. ?You have sudden and unexplained weight gain. ?Get help right away if: ?You develop shortness of breath or chest pain. ?You cannot breathe when you lie down. ?You develop pain, redness, or warmth in the swollen areas. ?You have heart, liver, or kidney disease and suddenly get edema. ?You have a fever and your symptoms suddenly get worse. ?Summary ?Edema is an abnormal buildup of fluids in the body tissues and under the skin. ?Eating too much salt (sodium) and being on your feet or sitting for a long time can cause edema in your legs, feet, and ankles. ?Keep the affected body part raised (elevated) above the level of your heart when you are sitting or lying down. ?This information is not intended to replace advice given to you by your health care provider. Make sure you discuss any questions you have  with your health care provider. ?Document Revised: 10/16/2020 Document Reviewed: 03/11/2020 ?Elsevier Patient Education ? 2022 [employer] ? ?

## 2021-08-03 NOTE — Progress Notes (Signed)
New Patient Office Visit  Subjective:  Patient ID: Allen Cooke, male    DOB: 1960/07/01  Age: 61 y.o. MRN: [id number]  CC:  Chief Complaint  Patient presents with   Edema    HPI Teri Diltz Kabat states that he is currently being treated for substance abuse at [facility] residential treatment center, states that he arrived February 13, is working on his plans for long-term care, does plan on leaving around 14 March.  Reports that he was recently started on blood pressure medication, was started on 10 mg of amlodipine and 12.5 mg of hydrochlorothiazide ,states that he is however taking 25 mg of hydrochlorothiazide.  States that his blood pressure readings have been elevated, similar or slightly higher than today's reading.  Reports that he has also been having bilateral lower edema, states that this started shortly after starting the blood pressure medication.  States that the swelling is worse at night.  Does endorse that he does have some shortness of breath on exertion, states at rest he sometimes feels like "I am not getting enough air".  States that he has been having right knee pain, states that he had an ACL tear 25 years ago from playing soccer.  Reports that the Mobic does offer relief, but states that his pain is present when he is trying to be active.  Does endorse occasional swelling.  Denies numbness or tingling.  Reports that his moods are mostly stable, states anxiety "comes and goes, but not as bad as before".  States that he has been having difficulty sleeping, states that he wakes up at 3 or 4 in the morning and has a difficult time falling back asleep.  States that he recently had a cholesterol panel drawn, states that he was not fasting at that time.  States that he was prescribed Flomax after complaining of waking several times throughout the night to urinate as well as having difficulty starting his urination during the day.  States that since he has been  taking that medication he has seen an improvement in his urination not only at night but during the day.   Past Medical History:  Diagnosis Date   Anxiety    Meningitis    as a teenager    Past Surgical History:  Procedure Laterality Date   ANTERIOR CRUCIATE LIGAMENT REPAIR Right 1985    Family History  Problem Relation Age of Onset   Migraines Mother    Coronary aneurysm Mother     Social History   Socioeconomic History   Marital status: Single    Spouse name: Not on file   Number of children: Not on file   Years of education: Not on file   Highest education level: Not on file  Occupational History   Not on file  Tobacco Use   Smoking status: Never   Smokeless tobacco: Never  Substance and Sexual Activity   Alcohol use: Yes    Comment: occ   Drug use: Never   Sexual activity: Not on file  Other Topics Concern   Not on file  Social History Narrative   Right handed   Social Determinants of Health   Financial Resource Strain: Not on file  Food Insecurity: Not on file  Transportation Needs: Not on file  Physical Activity: Not on file  Stress: Not on file  Social Connections: Not on file  Intimate Partner Violence: Not on file    ROS Review of Systems  Constitutional: Negative.   HENT:  Negative.    Eyes: Negative.   Respiratory:  Positive for shortness of breath. Negative for wheezing.   Cardiovascular:  Positive for leg swelling. Negative for chest pain and palpitations.  Gastrointestinal: Negative.   Endocrine: Negative.   Genitourinary:  Negative for difficulty urinating and urgency.  Musculoskeletal:  Positive for arthralgias and joint swelling.  Skin: Negative.   Allergic/Immunologic: Negative.   Neurological:  Negative for dizziness, syncope, weakness and headaches.  Hematological: Negative.   Psychiatric/Behavioral:  Positive for sleep disturbance. Negative for dysphoric mood, self-injury and suicidal ideas. The patient is not nervous/anxious.     Objective:   Today's Vitals: BP (!) 145/86 (BP Location: Right Arm, Patient Position: Sitting, Cuff Size: Large)    Pulse 87    Resp 18    Ht 5\' 10"  (1.778 m)    Wt 251 lb (113.9 kg)    SpO2 94%    BMI 36.01 kg/m   Physical Exam Vitals and nursing note reviewed.  Constitutional:      General: He is not in acute distress.    Appearance: Normal appearance. He is obese. He is not ill-appearing.  HENT:     Head: Normocephalic and atraumatic.     Right Ear: External ear normal.     Left Ear: External ear normal.     Nose: Nose normal.     Mouth/Throat:     Mouth: Mucous membranes are moist.     Pharynx: Oropharynx is clear.  Eyes:     Extraocular Movements: Extraocular movements intact.     Conjunctiva/sclera: Conjunctivae normal.     Pupils: Pupils are equal, round, and reactive to light.  Cardiovascular:     Rate and Rhythm: Normal rate and regular rhythm.  Pulmonary:     Effort: Pulmonary effort is normal.     Breath sounds: Normal breath sounds.  Musculoskeletal:     Cervical back: Normal range of motion and neck supple.     Right knee: Crepitus present. No swelling. Decreased range of motion. No tenderness.     Left knee: Normal.     Right lower leg: 1+ Pitting Edema present.     Left lower leg: 1+ Pitting Edema present.  Skin:    General: Skin is warm and dry.  Neurological:     General: No focal deficit present.     Mental Status: He is alert and oriented to person, place, and time.  Psychiatric:        Mood and Affect: Mood normal.        Behavior: Behavior normal.        Thought Content: Thought content normal.        Judgment: Judgment normal.    Assessment & Plan:   Problem List Items Addressed This Visit       Cardiovascular and Mediastinum   Essential hypertension - Primary   Relevant Medications   amLODipine (NORVASC) 5 MG tablet   hydrochlorothiazide (HYDRODIURIL) 50 MG tablet     Other   MDD (major depressive disorder), recurrent severe, without  psychosis (HCC)   Relevant Medications   hydrOXYzine (VISTARIL) 25 MG capsule   citalopram (CELEXA) 20 MG tablet   traZODone (DESYREL) 50 MG tablet   Other Relevant Orders   Vitamin D, 25-hydroxy (Completed)   Localized edema   Relevant Orders   Brain natriuretic peptide (Completed)   Psychophysiological insomnia   Chronic pain of right knee   Relevant Medications   meloxicam (MOBIC) 15 MG tablet   Other  Relevant Orders   DG Knee Complete 4 Views Right   Urinary hesitancy   Relevant Medications   tamsulosin (FLOMAX) 0.4 MG CAPS capsule   Other Relevant Orders   PSA (Completed)   Alcohol abuse   Methamphetamine abuse (HCC)   Other Visit Diagnoses     Seasonal allergies       Relevant Medications   loratadine (CLARITIN) 10 MG tablet   Encounter for HCV screening test for high risk patient       Relevant Orders   HCV Ab w Reflex to Quant PCR (Completed)   Screening for HIV (human immunodeficiency virus)       Relevant Orders   HIV antibody (with reflex) (Completed)   Class 2 severe obesity due to excess calories with serious comorbidity and body mass index (BMI) of 36.0 to 36.9 in adult [facility])           Outpatient Encounter Medications as of 08/03/2021  Medication Sig   sodium chloride (OCEAN) 0.65 % SOLN nasal spray Place 1 spray into both nostrils 4 (four) times daily as needed for congestion.   [DISCONTINUED] amLODipine (NORVASC) 10 MG tablet Take 1 tablet (10 mg total) by mouth daily.   [DISCONTINUED] citalopram (CELEXA) 20 MG tablet Take 1 tablet (20 mg total) by mouth daily.   [DISCONTINUED] hydrochlorothiazide (HYDRODIURIL) 25 MG tablet Take 0.5 tablets (12.5 mg total) by mouth daily.   [DISCONTINUED] hydrOXYzine (VISTARIL) 25 MG capsule Take 1 capsule (25 mg total) by mouth 3 (three) times daily as needed for anxiety.   [DISCONTINUED] loratadine (CLARITIN) 10 MG tablet Take 1 tablet (10 mg total) by mouth daily as needed for allergies (x 14 days then stop).    [DISCONTINUED] meloxicam (MOBIC) 15 MG tablet Take 1 tablet (15 mg total) by mouth daily.   [DISCONTINUED] tamsulosin (FLOMAX) 0.4 MG CAPS capsule Take 1 capsule (0.4 mg total) by mouth at bedtime.   [DISCONTINUED] traZODone (DESYREL) 50 MG tablet Take 1 tablet (50 mg total) by mouth at bedtime as needed for sleep.   amLODipine (NORVASC) 5 MG tablet Take 1 tablet (5 mg total) by mouth daily.   citalopram (CELEXA) 20 MG tablet Take 1 tablet (20 mg total) by mouth daily.   hydrochlorothiazide (HYDRODIURIL) 50 MG tablet Take 1 tablet (50 mg total) by mouth daily.   hydrOXYzine (VISTARIL) 25 MG capsule Take 1 capsule (25 mg total) by mouth 3 (three) times daily as needed for anxiety.   loratadine (CLARITIN) 10 MG tablet Take 1 tablet (10 mg total) by mouth daily.   meloxicam (MOBIC) 15 MG tablet Take 1 tablet (15 mg total) by mouth daily.   tamsulosin (FLOMAX) 0.4 MG CAPS capsule Take 1 capsule (0.4 mg total) by mouth at bedtime.   traZODone (DESYREL) 50 MG tablet Take 1-2 tabs PO qhs PRN   No facility-administered encounter medications on file as of 08/03/2021.  1. Essential hypertension Localized edema possibly from 10 mg of amlodipine, versus rule out heart failure.  Patient blood pressure is not controlled at these dosings, reduce amlodipine 5 mg, increase hydrochlorothiazide 50 mg.  Patient encouraged to continue checking blood pressure on a daily basis, keep a written log and have available for all office visits.  Patient to follow-up with mobile unit in 2 weeks.  Patient given application for [place] financial assistance.  Red flags given for prompt reevaluation.  Patient education given on low-sodium diet. - amLODipine (NORVASC) 5 MG tablet; Take 1 tablet (5 mg total) by mouth  daily.  Dispense: 30 tablet; Refill: 1 - hydrochlorothiazide (HYDRODIURIL) 50 MG tablet; Take 1 tablet (50 mg total) by mouth daily.  Dispense: 30 tablet; Refill: 1  2. Localized edema Patient education given on  supportive care - Brain natriuretic peptide  3. MDD (major depressive disorder), recurrent severe, without psychosis (HCC) Continue current regimen - hydrOXYzine (VISTARIL) 25 MG capsule; Take 1 capsule (25 mg total) by mouth 3 (three) times daily as needed for anxiety.  Dispense: 45 capsule; Refill: 1 - Vitamin D, 25-hydroxy - citalopram (CELEXA) 20 MG tablet; Take 1 tablet (20 mg total) by mouth daily.  Dispense: 30 tablet; Refill: 1 - traZODone (DESYREL) 50 MG tablet; Take 1-2 tabs PO qhs PRN  Dispense: 30 tablet; Refill: 1  4. Psychophysiological insomnia Trial trazodone, patient education given on good sleep hygiene  5. Chronic pain of right knee Continue current regimen - DG Knee Complete 4 Views Right; Future - meloxicam (MOBIC) 15 MG tablet; Take 1 tablet (15 mg total) by mouth daily.  Dispense: 30 tablet; Refill: 1  6. Urinary hesitancy Continue current regimen - PSA - tamsulosin (FLOMAX) 0.4 MG CAPS capsule; Take 1 capsule (0.4 mg total) by mouth at bedtime.  Dispense: 30 capsule; Refill: 1  7. Alcohol abuse Currently in substance abuse treatment program  8. Methamphetamine abuse (HCC) Currently in substance abuse treatment program  9. Seasonal allergies Continue current regimen - loratadine (CLARITIN) 10 MG tablet; Take 1 tablet (10 mg total) by mouth daily.  Dispense: 30 tablet; Refill: 1  10. Encounter for HCV screening test for high risk patient  - HCV Ab w Reflex to Quant PCR  11. Screening for HIV (human immunodeficiency virus)  - HIV antibody (with reflex)  12. Class 2 severe obesity due to excess calories with serious comorbidity and body mass index (BMI) of 36.0 to 36.9 in adult [facility](HCC)    I have reviewed the patient's medical history (PMH, PSH, Social History, Family History, Medications, and allergies) , and have been updated if relevant. I spent 30 minutes reviewing chart and  face to face time with patient.     Follow-up: Return in about 2 weeks  (around 08/17/2021).   Kasandra Knudsenari S Mayers, PA-C

## 2021-08-04 DIAGNOSIS — I10 Essential (primary) hypertension: Secondary | ICD-10-CM | POA: Insufficient documentation

## 2021-08-04 DIAGNOSIS — R600 Localized edema: Secondary | ICD-10-CM | POA: Insufficient documentation

## 2021-08-04 DIAGNOSIS — F1010 Alcohol abuse, uncomplicated: Secondary | ICD-10-CM | POA: Insufficient documentation

## 2021-08-04 DIAGNOSIS — F5104 Psychophysiologic insomnia: Secondary | ICD-10-CM | POA: Insufficient documentation

## 2021-08-04 DIAGNOSIS — G8929 Other chronic pain: Secondary | ICD-10-CM | POA: Insufficient documentation

## 2021-08-04 DIAGNOSIS — R3911 Hesitancy of micturition: Secondary | ICD-10-CM | POA: Insufficient documentation

## 2021-08-04 DIAGNOSIS — F1510 Other stimulant abuse, uncomplicated: Secondary | ICD-10-CM | POA: Insufficient documentation

## 2021-08-04 LAB — PSA: Prostate Specific Ag, Serum: 3.3 ng/mL (ref 0.0–4.0)

## 2021-08-04 LAB — HCV INTERPRETATION

## 2021-08-04 LAB — BRAIN NATRIURETIC PEPTIDE: BNP: 4.5 pg/mL (ref 0.0–100.0)

## 2021-08-04 LAB — VITAMIN D 25 HYDROXY (VIT D DEFICIENCY, FRACTURES): Vit D, 25-Hydroxy: 19.7 ng/mL — ABNORMAL LOW (ref 30.0–100.0)

## 2021-08-04 LAB — HIV ANTIBODY (ROUTINE TESTING W REFLEX): HIV Screen 4th Generation wRfx: NONREACTIVE

## 2021-08-04 LAB — HCV AB W REFLEX TO QUANT PCR: HCV Ab: NONREACTIVE

## 2021-08-05 ENCOUNTER — Emergency Department (HOSPITAL_BASED_OUTPATIENT_CLINIC_OR_DEPARTMENT_OTHER)
Admission: EM | Admit: 2021-08-05 | Discharge: 2021-08-06 | Disposition: A | Discharge status: Home / Self Care | Payer: Self-pay | Attending: Emergency Medicine | Admitting: Emergency Medicine

## 2021-08-05 ENCOUNTER — Other Ambulatory Visit: Payer: Self-pay

## 2021-08-05 ENCOUNTER — Telehealth: Payer: Self-pay | Admitting: *Deleted

## 2021-08-05 ENCOUNTER — Emergency Department (HOSPITAL_BASED_OUTPATIENT_CLINIC_OR_DEPARTMENT_OTHER): Payer: Self-pay

## 2021-08-05 ENCOUNTER — Encounter (HOSPITAL_BASED_OUTPATIENT_CLINIC_OR_DEPARTMENT_OTHER): Payer: Self-pay

## 2021-08-05 DIAGNOSIS — R7309 Other abnormal glucose: Secondary | ICD-10-CM | POA: Insufficient documentation

## 2021-08-05 DIAGNOSIS — L0201 Cutaneous abscess of face: Secondary | ICD-10-CM | POA: Insufficient documentation

## 2021-08-05 DIAGNOSIS — I10 Essential (primary) hypertension: Secondary | ICD-10-CM | POA: Insufficient documentation

## 2021-08-05 DIAGNOSIS — L0291 Cutaneous abscess, unspecified: Secondary | ICD-10-CM

## 2021-08-05 DIAGNOSIS — L03213 Periorbital cellulitis: Secondary | ICD-10-CM | POA: Insufficient documentation

## 2021-08-05 DIAGNOSIS — Z79899 Other long term (current) drug therapy: Secondary | ICD-10-CM | POA: Insufficient documentation

## 2021-08-05 DIAGNOSIS — L989 Disorder of the skin and subcutaneous tissue, unspecified: Secondary | ICD-10-CM | POA: Insufficient documentation

## 2021-08-05 HISTORY — DX: Alcohol abuse, uncomplicated: F10.10

## 2021-08-05 LAB — CBC WITH DIFFERENTIAL/PLATELET
Abs Immature Granulocytes: 0.01 10*3/uL (ref 0.00–0.07)
Basophils Absolute: 0.1 10*3/uL (ref 0.0–0.1)
Basophils Relative: 1 %
Eosinophils Absolute: 0.2 10*3/uL (ref 0.0–0.5)
Eosinophils Relative: 3 %
HCT: 41.1 % (ref 39.0–52.0)
Hemoglobin: 14 g/dL (ref 13.0–17.0)
Immature Granulocytes: 0 %
Lymphocytes Relative: 33 %
Lymphs Abs: 1.8 10*3/uL (ref 0.7–4.0)
MCH: 29.9 pg (ref 26.0–34.0)
MCHC: 34.1 g/dL (ref 30.0–36.0)
MCV: 87.6 fL (ref 80.0–100.0)
Monocytes Absolute: 0.6 10*3/uL (ref 0.1–1.0)
Monocytes Relative: 10 %
Neutro Abs: 2.8 10*3/uL (ref 1.7–7.7)
Neutrophils Relative %: 53 %
Platelets: 208 10*3/uL (ref 150–400)
RBC: 4.69 MIL/uL (ref 4.22–5.81)
RDW: 13.2 % (ref 11.5–15.5)
WBC: 5.3 10*3/uL (ref 4.0–10.5)
nRBC: 0 % (ref 0.0–0.2)

## 2021-08-05 LAB — BASIC METABOLIC PANEL
Anion gap: 9 (ref 5–15)
BUN: 19 mg/dL (ref 8–23)
CO2: 26 mmol/L (ref 22–32)
Calcium: 8.9 mg/dL (ref 8.9–10.3)
Chloride: 102 mmol/L (ref 98–111)
Creatinine, Ser: 1.19 mg/dL (ref 0.61–1.24)
GFR, Estimated: >60 mL/min (ref >60)
Glucose, Bld: 122 mg/dL — ABNORMAL HIGH (ref 70–99)
Potassium: 3.7 mmol/L (ref 3.5–5.1)
Sodium: 137 mmol/L (ref 135–145)

## 2021-08-05 IMAGING — CT CT ORBITS W/ CM
3 series · 14 of 47 positions shown, 16 images · IV contrast (Omnipaque)
Comparison: Prior MRI from [DATE].

CLINICAL DATA: Initial evaluation for orbital cellulitis.

EXAM:
CT ORBITS WITH CONTRAST
TECHNIQUE: Multidetector CT images was performed according to the standard
protocol following intravenous contrast administration.

[Series 2: orbits 2.0 h30s st · axial · 0.36mm/px · z∈[+331,+413]mm · 8 of 49 slices shown, 10 images]
[im 4/49  brain]
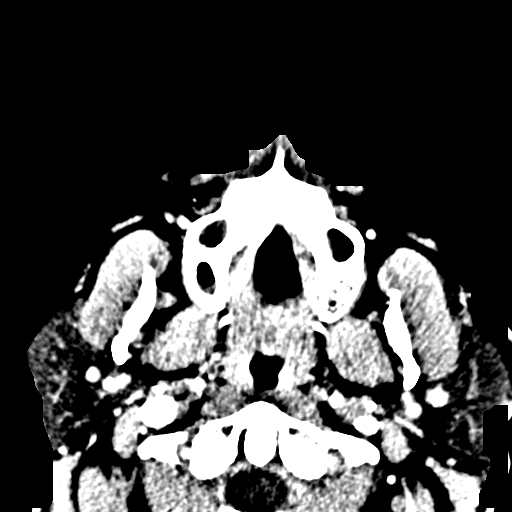
[im 4/49  bone]
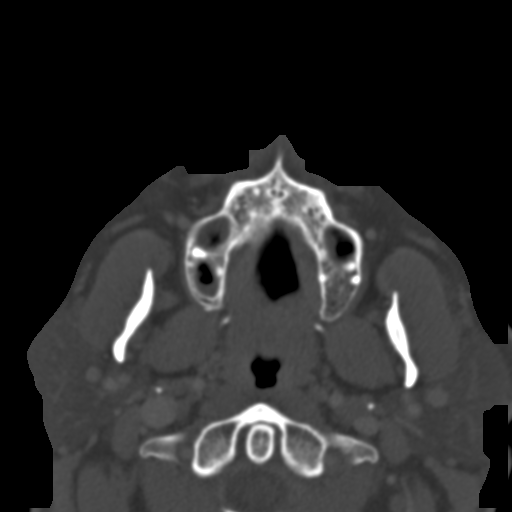
[im 10/49  bone]
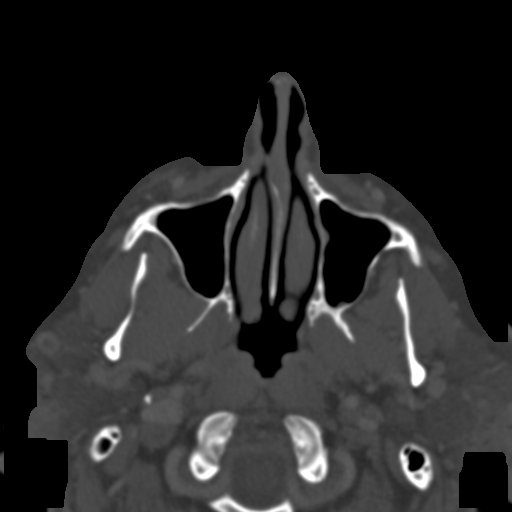
[im 15/49  bone]
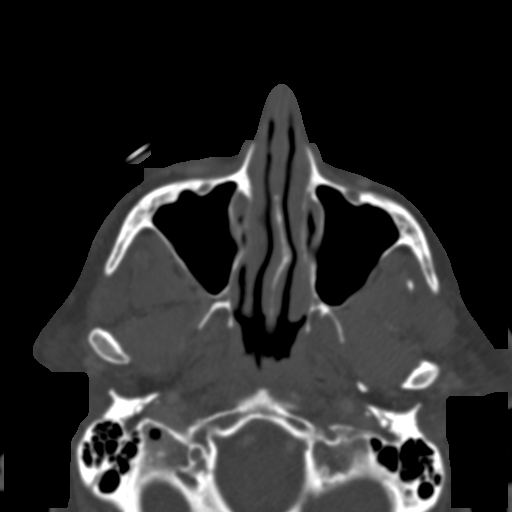
[im 22/49  bone]
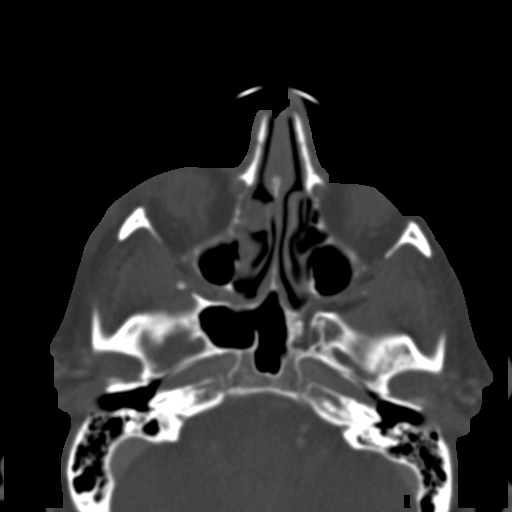
[im 27/49  brain]
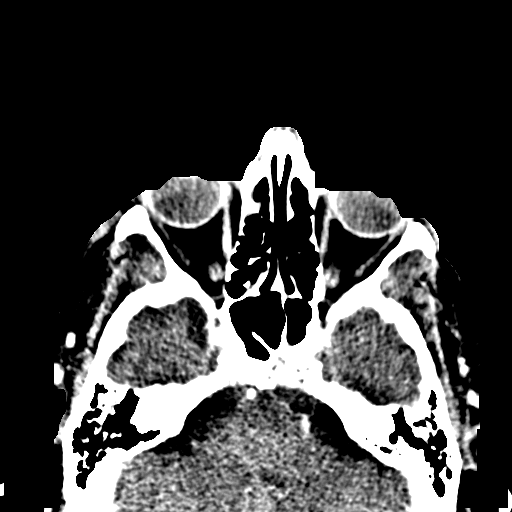
[im 27/49  bone]
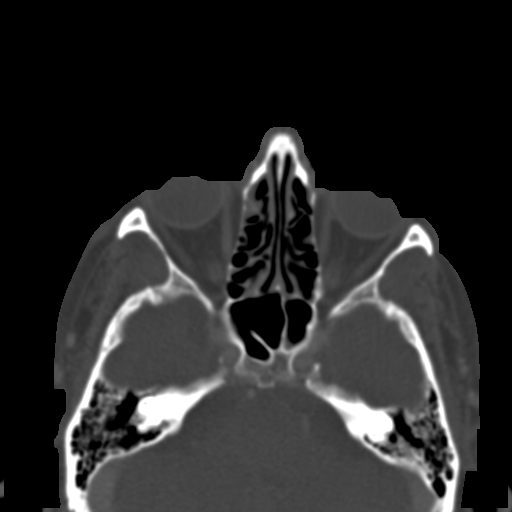
[im 34/49  bone]
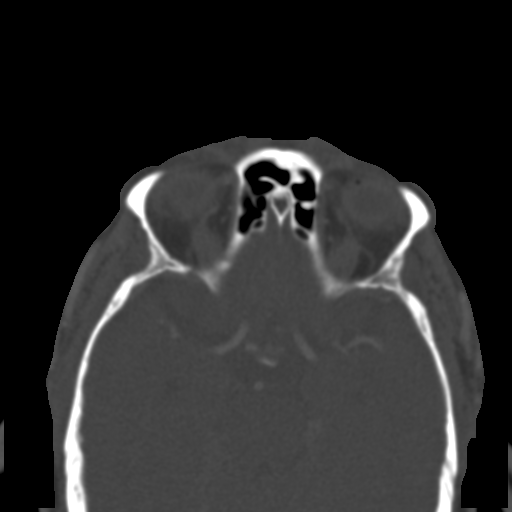
[im 39/49  bone]
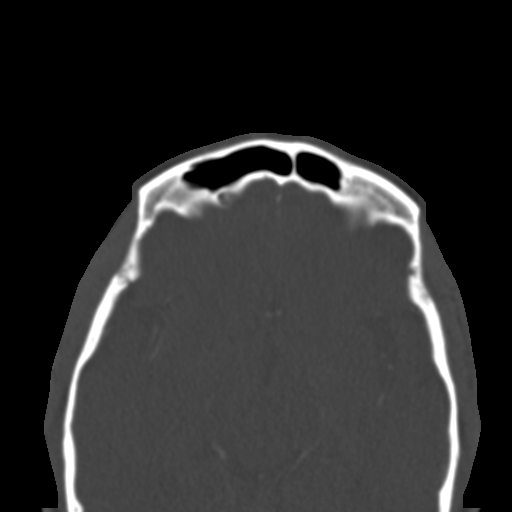
[im 45/49  bone]
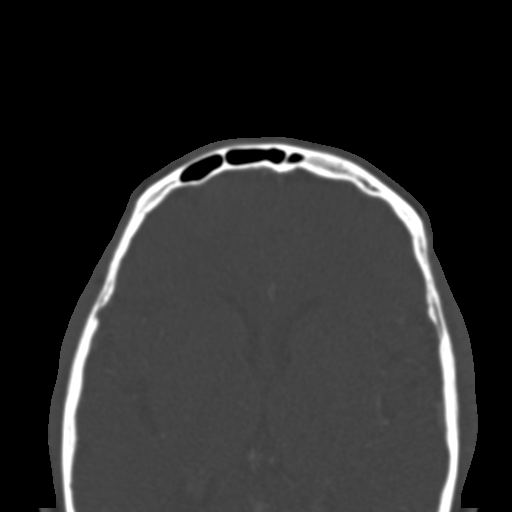

[Series 8: orbits 2.0 coronal st · coronal · 0.22mm/px · 3 of 75 slices shown]
[im 25/75  bone]
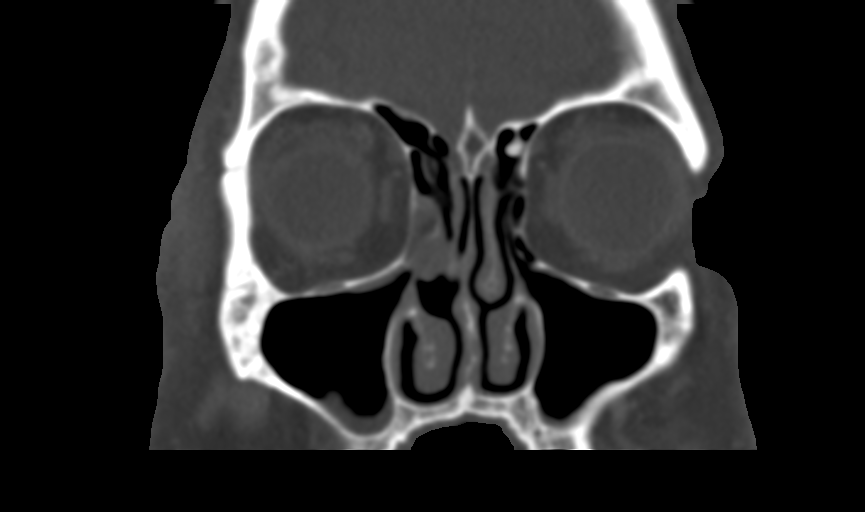
[im 33/75  bone]
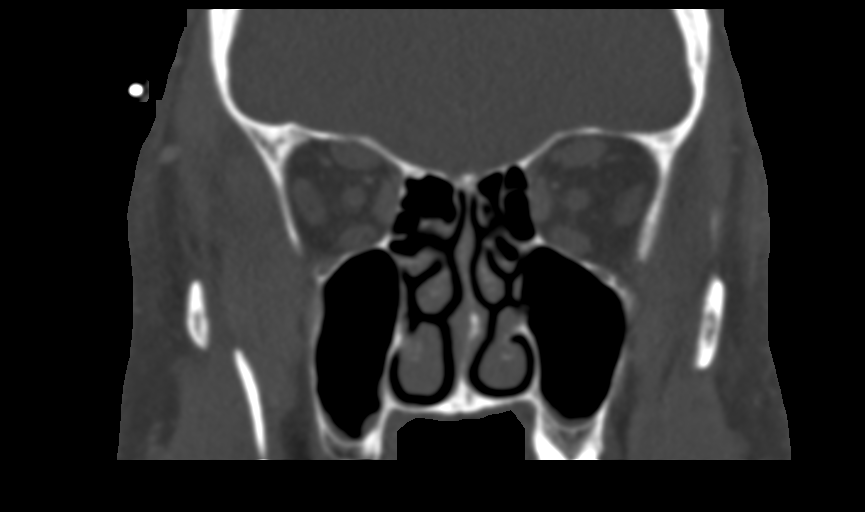
[im 42/75  bone]
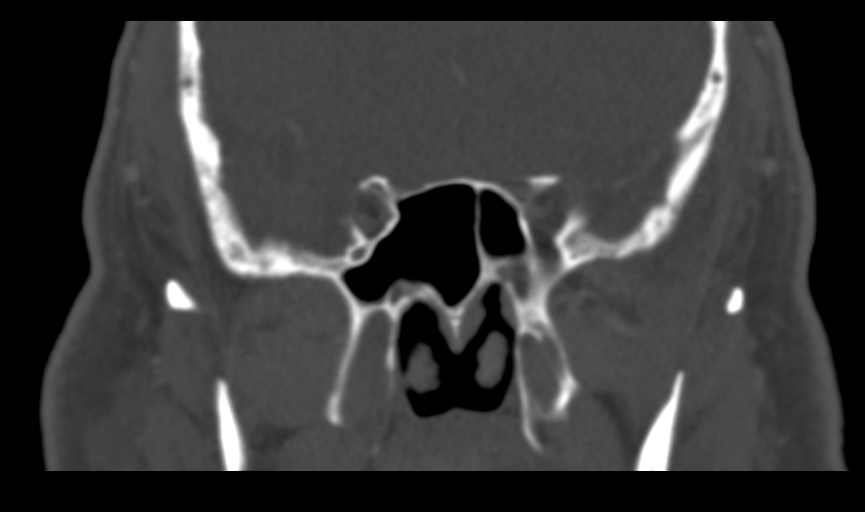

[Series 9: orbits 2.0 sagittal st · sagittal · 0.22mm/px · 3 of 88 slices shown]
[im 30/88  bone]
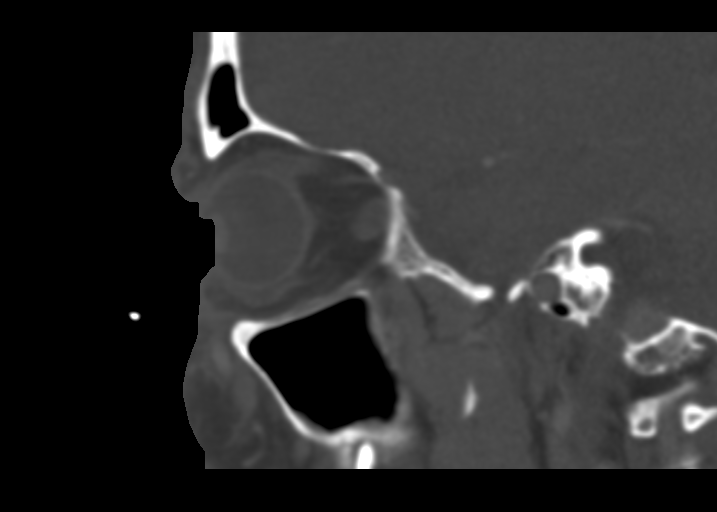
[im 44/88  bone]
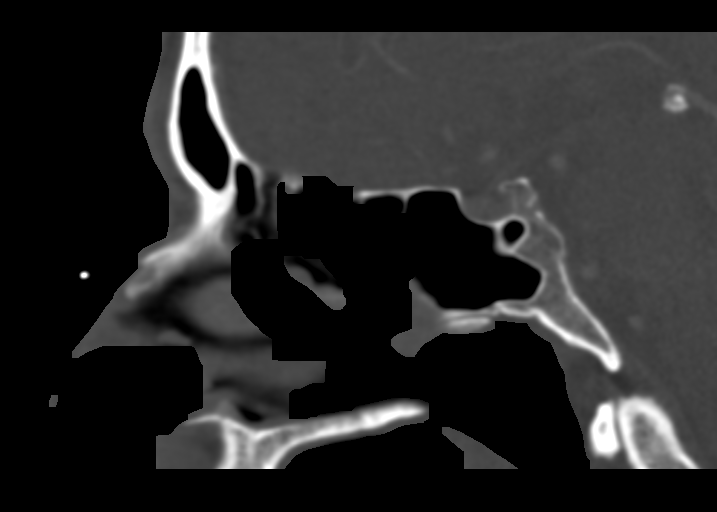
[im 59/88  bone]
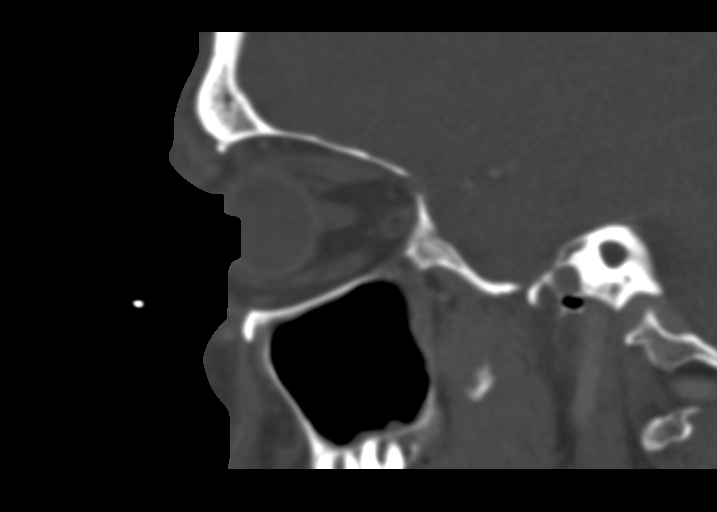

[14 of 47 positions shown; findings below may reference images not displayed]

RADIATION DOSE REDUCTION: This exam was performed according to the
departmental dose-optimization program which includes automated
exposure control, adjustment of the mA and/or kV according to
patient size and/or use of iterative reconstruction technique.

CONTRAST:  100mL OMNIPAQUE IOHEXOL 300 MG/ML  SOLN
FINDINGS: Orbits: Globes are symmetric in size with normal appearance and
morphology bilaterally. Optic nerves symmetric and normal.
Extra-ocular muscles within normal limits. Intraconal and extraconal
fat well-maintained. Lacrimal glands normal. No abnormality about
the orbital apices or cavernous sinus. Superior orbital veins
symmetric and normal. No evidence for intraorbital or postseptal
cellulitis.

Visible paranasal sinuses: Scattered mucoperiosteal thickening noted
within the ethmoidal air cells and maxillary sinuses, mild in
nature. No air-fluid levels to suggest acute sinusitis. Mastoid air
cells and middle ear cavities are well pneumatized and free of
fluid.

Soft tissues: Soft tissue swelling and edema seen involving the
preseptal right periorbital soft tissues, most pronounced within the
infraorbital region. Finding is nonspecific, but could reflect
changes of acute preseptal cellulitis/infection. Again, no evidence
for intraorbital or postseptal extension. No discrete abscess or
drainable fluid collection. Remainder of the visualized facial soft
tissues demonstrate no other acute finding.

Osseous: No acute osseous finding. No discrete or worrisome osseous
lesions.

Limited intracranial: Unremarkable.
IMPRESSION: Soft tissue swelling and edema involving the right preseptal
periorbital soft tissues, most pronounced within the infraorbital
region. Given provided history, findings suggestive of acute
preseptal cellulitis/infection. No evidence for intraorbital or
postseptal extension. No discrete abscess or drainable fluid
collection.

## 2021-08-05 MED ORDER — IOHEXOL 300 MG/ML  SOLN
100.0000 mL | Freq: Once | INTRAMUSCULAR | Status: AC | PRN
  Administered 2021-08-05: 100 mL via INTRAVENOUS

## 2021-08-05 NOTE — Telephone Encounter (Signed)
MA spoke with RN Silva Bandy and shared patient results. ?

## 2021-08-05 NOTE — ED Provider Notes (Cosign Needed)
[facility] EMERGENCY DEPARTMENT Provider Note   CSN: [id number] Arrival date & time: 08/05/21  1813     History  Chief Complaint  Patient presents with   Abscess    Allen Cooke is a 61 y.o. male presenting today with a possible abscess or spider bite on his right cheek.  Patient woke up yesterday morning and noticed a painful small bump on his right cheek that was not there when he went to bed.  Denies trauma.  Attempted to drain it by squeezing, but nothing came out.  The pain increased throughout the day as did the swelling.  Patient woke up the next morning and said it was difficult to open his eye.  Used an ice pack for 20 to 30 minutes which improved the swelling.  Patient endorses intermittent vision changes over the last day to that eye, such as blurry vision or diplopia.  Patient states the eye also began draining clear fluid as of this morning.  No significant history of acne.  Denies fever or recent trauma.  Patient states he has been congested over the last 3 weeks or so and has been taking some OTC allergy medications with no help.  The history is provided by the patient and medical records.  Abscess     Home Medications Prior to Admission medications   Medication Sig Start Date End Date Taking? Authorizing Provider  amLODipine (NORVASC) 5 MG tablet Take 1 tablet (5 mg total) by mouth daily. 08/03/21 08/03/22  Mayers, Cari S, PA-C  amoxicillin-clavulanate (AUGMENTIN) 875-125 MG tablet Take 1 tablet by mouth 2 (two) times daily. 08/06/21   Cecil Cobbs, PA-C  citalopram (CELEXA) 20 MG tablet Take 1 tablet (20 mg total) by mouth daily. 08/03/21 08/03/22  Mayers, Cari S, PA-C  hydrochlorothiazide (HYDRODIURIL) 50 MG tablet Take 1 tablet (50 mg total) by mouth daily. 08/03/21   Mayers, Cari S, PA-C  hydrOXYzine (VISTARIL) 25 MG capsule Take 1 capsule (25 mg total) by mouth 3 (three) times daily as needed for anxiety. 08/03/21   Mayers, Cari S, PA-C  loratadine  (CLARITIN) 10 MG tablet Take 1 tablet (10 mg total) by mouth daily. 08/03/21   Mayers, Cari S, PA-C  meloxicam (MOBIC) 15 MG tablet Take 1 tablet (15 mg total) by mouth daily. 08/03/21 08/03/22  Mayers, Cari S, PA-C  sodium chloride (OCEAN) 0.65 % SOLN nasal spray Place 1 spray into both nostrils 4 (four) times daily as needed for congestion. 07/12/21   Ardis Hughs, NP  sulfamethoxazole-trimethoprim (BACTRIM DS) 800-160 MG tablet Take 1 tablet by mouth 2 (two) times daily for 7 days. 08/06/21 08/13/21  Cecil Cobbs, PA-C  tamsulosin (FLOMAX) 0.4 MG CAPS capsule Take 1 capsule (0.4 mg total) by mouth at bedtime. 08/03/21   Mayers, Cari S, PA-C  traZODone (DESYREL) 50 MG tablet Take 1-2 tabs PO qhs PRN 08/03/21   Mayers, Cari S, PA-C      Allergies    Patient has no known allergies.    Review of Systems   Review of Systems  Eyes:  Positive for discharge (Clear) and visual disturbance.  Skin:  Positive for wound.   Physical Exam Updated Vital Signs BP 130/75    Pulse 78    Temp 98 F (36.7 C) (Oral)    Resp (!) 26    Ht 5\' 10"  (1.778 m)    Wt 116.1 kg    SpO2 94%    BMI 36.73 kg/m  Physical Exam  Vitals and nursing note reviewed.  Constitutional:      General: He is not in acute distress.    Appearance: Normal appearance. He is well-developed. He is obese. He is not ill-appearing or diaphoretic.  HENT:     Head: Normocephalic and atraumatic. No raccoon eyes or Battle's sign.      Comments: 2 cm X 2 cm indurated abscess with surrounding fluctuance, erythema, warmth, without active discharge Observed edema of surrounding tissue EOMs normal, eyes PERRL bilaterally, abnormal counts fingers test Patient with subjective vision complaints during exam Right eye with mildly increased tear production     Nose: Nose normal.     Mouth/Throat:     Mouth: Mucous membranes are moist.     Pharynx: Oropharynx is clear.  Eyes:     General: No scleral icterus.    Extraocular Movements: Extraocular  movements intact.     Conjunctiva/sclera: Conjunctivae normal.     Pupils: Pupils are equal, round, and reactive to light.  Cardiovascular:     Rate and Rhythm: Normal rate and regular rhythm.  Pulmonary:     Effort: Pulmonary effort is normal.  Abdominal:     Palpations: Abdomen is soft.  Musculoskeletal:        General: No swelling.     Cervical back: Neck supple.  Skin:    General: Skin is warm and dry.     Capillary Refill: Capillary refill takes less than 2 seconds.     Findings: Lesion present.  Neurological:     Mental Status: He is alert and oriented to person, place, and time.  Psychiatric:        Mood and Affect: Mood normal.    ED Results / Procedures / Treatments   Labs (all labs ordered are listed, but only abnormal results are displayed) Labs Reviewed  BASIC METABOLIC PANEL - Abnormal; Notable for the following components:      Result Value   Glucose, Bld 122 (*)    All other components within normal limits  CBC WITH DIFFERENTIAL/PLATELET    EKG None  Radiology CT Orbits W Contrast  Result Date: 08/05/2021 CLINICAL DATA:  Initial evaluation for orbital cellulitis. EXAM: CT ORBITS WITH CONTRAST TECHNIQUE: Multidetector CT images was performed according to the standard protocol following intravenous contrast administration. RADIATION DOSE REDUCTION: This exam was performed according to the departmental dose-optimization program which includes automated exposure control, adjustment of the mA and/or kV according to patient size and/or use of iterative reconstruction technique. CONTRAST:  OMNIPAQUE IOHEXOL 300 MG/ML  SOLN COMPARISON:  Prior MRI from 01/16/2021. FINDINGS: Orbits: Globes are symmetric in size with normal appearance and morphology bilaterally. Optic nerves symmetric and normal. Extra-ocular muscles within normal limits. Intraconal and extraconal fat well-maintained. Lacrimal glands normal. No abnormality about the orbital apices or cavernous sinus.  Superior orbital veins symmetric and normal. No evidence for intraorbital or postseptal cellulitis. Visible paranasal sinuses: Scattered mucoperiosteal thickening noted within the ethmoidal air cells and maxillary sinuses, mild in nature. No air-fluid levels to suggest acute sinusitis. Mastoid air cells and middle ear cavities are well pneumatized and free of fluid. Soft tissues: Soft tissue swelling and edema seen involving the preseptal right periorbital soft tissues, most pronounced within the infraorbital region. Finding is nonspecific, but could reflect changes of acute preseptal cellulitis/infection. Again, no evidence for intraorbital or postseptal extension. No discrete abscess or drainable fluid collection. Remainder of the visualized facial soft tissues demonstrate no other acute finding. Osseous: No acute osseous finding. No  discrete or worrisome osseous lesions. Limited intracranial: Unremarkable. IMPRESSION: Soft tissue swelling and edema involving the right preseptal periorbital soft tissues, most pronounced within the infraorbital region. Given provided history, findings suggestive of acute preseptal cellulitis/infection. No evidence for intraorbital or postseptal extension. No discrete abscess or drainable fluid collection. Electronically Signed   By: Rise Mu M.D.   On: 08/05/2021 23:26    Procedures Procedures    Medications Ordered in ED Medications  iohexol (OMNIPAQUE) 300 MG/ML solution 100 mL (100 mLs Intravenous Contrast Given 08/05/21 2242)  amoxicillin-clavulanate (AUGMENTIN) 875-125 MG per tablet 1 tablet (1 tablet Oral Given 08/06/21 0051)  sulfamethoxazole-trimethoprim (BACTRIM DS) 800-160 MG per tablet 1 tablet (1 tablet Oral Given 08/06/21 0051)    ED Course/ Medical Decision Making/ A&P                           Medical Decision Making Amount and/or Complexity of Data Reviewed External Data Reviewed: notes. Labs: ordered. Decision-making details documented in  ED Course. Radiology: ordered and independent interpretation performed. Decision-making details documented in ED Course.  Risk Prescription drug management.   61 y.o. male presents to the ED for concern of Abscess  .  This involves an extensive number of treatment options, and is a complaint that carries with it a high risk of complications and morbidity.  The differential diagnosis includes abscess, folliculitis, cellulitis  Comorbidities that complicate the patient evaluation include hypertension, MDD, ETOH abuse, methamphetamine abuse  Additional history obtained from internal/external records available via epic  Interpretation: I ordered, and personally interpreted labs.  The pertinent results include: Mild elevated glucose which is an incidental finding.  CBC does not indicate elevated WBC, unlikely inflammation/infection.  Also no signs of anemia.  Electrolytes unremarkable.  Kidney function appears adequate, within normal range enough to tolerate contrast.  I ordered imaging studies including CT of the orbits with contrast.  I independently visualized and interpreted imaging which showed preorbital cellulitis without orbital or postorbital involvement.  I agree with the radiologist interpretation  Intervention: I ordered medication including antibiotics for purpose of beginning treatment of infection.  Reevaluation of the patient after these medicines showed that the patient tolerated this well.  I have reviewed the patients home medicines and have made adjustments as needed  After the interventions noted above, I reevaluated the patient and found that they have tolerated the procedure well and appear stable enough for discharge.  ED Course: Considered erythema migrans, but physical exam not suggestive of this.  Physical exam suggestive of progressing skin infection, likely cellulitis.  Strongly supported via imaging as described above.  Considered possible spider bite, but spider not  seen/documented by self or patient and physical exam not supportive of this.  Considered folliculitis but physical exam not suggestive, and treatment course would likely be the same given the patient's presentation.   No evidence of sepsis from labs or assessment.  Physical exam and imaging not suggestive of infection to the ocular components.  Minimal right eye vision changes on physical exam prompts immediate antibiotic treatment and close follow-up with ophthalmology.  Patient appears stable, vitals within normal limits on physical exam and all reassessments.   Strict return precautions discussed.    Disposition: I discussed the patient and their case with my attending, Dr. Audley Hose, who agreed with the proposed treatment course.  The patient is safe for discharge and has been instructed to return immediately for worsening symptoms, change in symptoms or any  other concerns.  After consideration of the diagnostic results and the patient's response to treatment, I feel that the patient would benefit from close follow-up with ophthalmology and with primary care for continued treatment and re-evaluation.  Discussed course of treatment thoroughly with the patient and he demonstrated understanding.  Patient in agreement and has no further questions.        Final Clinical Impression(s) / ED Diagnoses Final diagnoses:  Abscess  Preseptal cellulitis of right eye    Rx / DC Orders ED Discharge Orders          Ordered    sulfamethoxazole-trimethoprim (BACTRIM DS) 800-160 MG tablet  2 times daily,   Status:  Discontinued        08/06/21 0022    amoxicillin-clavulanate (AUGMENTIN) 875-125 MG tablet  2 times daily,   Status:  Discontinued        08/06/21 0022    amoxicillin-clavulanate (AUGMENTIN) 875-125 MG tablet  2 times daily        08/06/21 0042    sulfamethoxazole-trimethoprim (BACTRIM DS) 800-160 MG tablet  2 times daily        08/06/21 0042              Cecil Cobbsockerham, Zanovia Rotz M,  PA-C 08/06/21 1529

## 2021-08-05 NOTE — Telephone Encounter (Signed)
-----   Message from Kennieth Rad, PA-C sent at 08/04/2021  2:49 PM EST ----- ?Please call patient and let him know that it marker for heart failure was within normal limits, his swelling is more than likely not due to that and more than likely due to the dosing of amlodipine.  Was negative.  His screening for hepatitis C and HIV were negative.  His vitamin D levels are low, he needs to take 2000 units once daily.  He will purchase this over-the-counter. ?

## 2021-08-05 NOTE — ED Triage Notes (Addendum)
Pt c/o ?abscess to right side of face-sx started yesterday-pt added he is at [facility] (no paperwork with pt) x 22 days and was advsied by medical staff he needs a right knee xray due to painful ambulation-denies recent injury-NAD ?

## 2021-08-05 NOTE — ED Notes (Signed)
Patient returned from CT at this time.

## 2021-08-06 MED ORDER — SULFAMETHOXAZOLE-TRIMETHOPRIM 800-160 MG PO TABS: 1.0000 | ORAL_TABLET | Freq: Two times a day (BID) | ORAL | 0 refills | Status: DC

## 2021-08-06 MED ORDER — AMOXICILLIN-POT CLAVULANATE 875-125 MG PO TABS: 1.0000 | ORAL_TABLET | Freq: Two times a day (BID) | ORAL | 0 refills | Status: DC

## 2021-08-06 MED ORDER — SULFAMETHOXAZOLE-TRIMETHOPRIM 800-160 MG PO TABS
1.0000 | ORAL_TABLET | Freq: Once | ORAL | Status: AC
  Administered 2021-08-06: 01:00:00 1 via ORAL
  Filled 2021-08-06: qty 1

## 2021-08-06 MED ORDER — SULFAMETHOXAZOLE-TRIMETHOPRIM 800-160 MG PO TABS: 1.0000 | ORAL_TABLET | Freq: Two times a day (BID) | ORAL | 0 refills | Status: AC

## 2021-08-06 MED ORDER — AMOXICILLIN-POT CLAVULANATE 875-125 MG PO TABS
1.0000 | ORAL_TABLET | Freq: Once | ORAL | Status: AC
  Administered 2021-08-06: 01:00:00 1 via ORAL
  Filled 2021-08-06: qty 1

## 2021-08-06 NOTE — ED Notes (Signed)
Patient provided with crackers to take with PO medications ?

## 2021-08-06 NOTE — ED Notes (Signed)
Patient reports "I'm feeling a little weird after the IV contrast."  Patient placed on cardiac monitor due to patient's concerns ?

## 2021-08-06 NOTE — Discharge Instructions (Addendum)
You have been provided two prescription for antibiotics.  Please fill these first thing in the morning and take as directed.  Always try to take with food and water, as they may cause an upset stomach if taken without. ? ?You have also been provided the contact information for an Ophthalmology office by the name of Dr. Wynelle Link.  Please call first thing in the morning to be seen in the next 24-48 hours for consultation and reassessment ? ?Please also follow up with your primary care physician in the next 24-48 hours for re-evaluation. ? ?Return to the ED for new or worsening symptoms as discussed. ?

## 2021-08-24 NOTE — Progress Notes (Deleted)
? ?NEUROLOGY FOLLOW UP OFFICE NOTE ? ?Allen Cooke ?[id number] ? ?Assessment/Plan:  ? ?Worsening headaches over past year - may be migraine but given age and change in headache, would rule out secondary etiologies ?Family history of cerebral aneurysm ?Elevated blood pressure - patient not on antihypertensive and reports high blood pressure over past couple of years. ?  ?Check CTA of head and neck ?Check sed rate and CRP ?Start topiramate 50mg  at bedtime - we can increase to 100mg  at bedtime in 6 weeks if needed ?Limit use of pain relievers to no more than 2 days out of week to prevent risk of rebound or medication-overuse headache. ?Keep headache diary ?Follow up with PCP regarding blood pressure ?Follow up with me in 6 months. ?  ?  ?  ?Subjective:  ?Allen Cooke is a 61 year old right-handed male who follows up for headaches. ? ?UPDATE: ?Last visit, ordered testing for secondary causes of headache.  Patient did not have this performed.  I started him on topiramate ***.    ?Intensity:  *** ?Duration:  *** ?Frequency:  *** ? ?This past month, he was diagnosed with right orbital cellulitis.  *** ? ?Frequency of abortive medication: *** ?Current NSAIDS/analgesics:  *** ?Current triptans:  *** ?Current ergotamine:  *** ?Current anti-emetic:  *** ?Current muscle relaxants:  *** ?Current Antihypertensive medications:  *** ?Current Antidepressant medications:  *** ?Current Anticonvulsant medications:  topiramate *** ?Current anti-CGRP:  *** ?Current Vitamins/Herbal/Supplements:  *** ?Current Antihistamines/Decongestants:  *** ?Other therapy:  *** ?Other medication:  *** ? ?Caffeine:  *** ?Alcohol:  *** ?Smoker:  *** ?Diet:  *** ?Exercise:  *** ?Depression:  ***; Anxiety:  *** ?Other pain:  *** ?Sleep hygiene:  *** ? ?  ?HISTORY:  ?He has history of headaches for several years but they were very infrequent and not severe.  Since around 2020, they have become mor frequent and severe.  He describes a 9/10  non-throbbing/squeezing pain in the right parietal/retro-orbital/vertex region with photophobia, mild nausea, both eyes water.  No associated fever, nuchal rigidity, vomiting, phonophobia, osmophobia, nasal congestion/rhinorrhea, ptosis, conjunctival injection, visual disturbance, numbness or weakness.  Quick head movement aggravates it.  They last several hours until he falls asleep and they occur 2 times a week.  He treats with either ibuprofen, ASA or acetaminophen.  He reports increased stress over the past year which may be a trigger for increased headaches.  On the evening of 01/15/2021, he woke up with one of these headaches with tingling of his left arm but no weakness.  The next day, he had elevated blood pressure when he checked it at the pharmacy.  He went to the ED.  Blood pressure was 180/102.  CT and MRI of brain personally reviewed were unremarkable for acute intracranial abnormality.  Treated for headache which resolved.   ?  ?Other history:  he had meningitis at age 8 ?Family history:  Mother passed away due to ruptured cerebral aneurysm ? ?PAST MEDICAL HISTORY: ?Past Medical History:  ?Diagnosis Date  ? Anxiety   ? ETOH abuse   ? Meningitis   ? as a teenager  ? ? ?MEDICATIONS: ?Current Outpatient Medications on File Prior to Visit  ?Medication Sig Dispense Refill  ? amLODipine (NORVASC) 5 MG tablet Take 1 tablet (5 mg total) by mouth daily. 30 tablet 1  ? amoxicillin-clavulanate (AUGMENTIN) 875-125 MG tablet Take 1 tablet by mouth 2 (two) times daily. 14 tablet 0  ? citalopram (CELEXA) 20 MG tablet Take 1  tablet (20 mg total) by mouth daily. 30 tablet 1  ? hydrochlorothiazide (HYDRODIURIL) 50 MG tablet Take 1 tablet (50 mg total) by mouth daily. 30 tablet 1  ? hydrOXYzine (VISTARIL) 25 MG capsule Take 1 capsule (25 mg total) by mouth 3 (three) times daily as needed for anxiety. 45 capsule 1  ? loratadine (CLARITIN) 10 MG tablet Take 1 tablet (10 mg total) by mouth daily. 30 tablet 1  ? meloxicam  (MOBIC) 15 MG tablet Take 1 tablet (15 mg total) by mouth daily. 30 tablet 1  ? sodium chloride (OCEAN) 0.65 % SOLN nasal spray Place 1 spray into both nostrils 4 (four) times daily as needed for congestion. 1 mL 0  ? tamsulosin (FLOMAX) 0.4 MG CAPS capsule Take 1 capsule (0.4 mg total) by mouth at bedtime. 30 capsule 1  ? traZODone (DESYREL) 50 MG tablet Take 1-2 tabs PO qhs PRN 30 tablet 1  ? ?No current facility-administered medications on file prior to visit.  ? ? ?ALLERGIES: ?No Known Allergies ? ?FAMILY HISTORY: ?Family History  ?Problem Relation Age of Onset  ? Migraines Mother   ? Coronary aneurysm Mother   ? ? ?  ?Objective:  ?*** ?General: No acute distress.  Patient appears ***-groomed.   ?Head:  Normocephalic/atraumatic ?Eyes:  Fundi examined but not visualized ?Neck: supple, no paraspinal tenderness, full range of motion ?Heart:  Regular rate and rhythm ?Lungs:  Clear to auscultation bilaterally ?Back: No paraspinal tenderness ?Neurological Exam: alert and oriented to person, place, and time.  Speech fluent and not dysarthric, language intact.  CN II-XII intact. Bulk and tone normal, muscle strength 5/5 throughout.  Sensation to light touch intact.  Deep tendon reflexes 2+ throughout, toes downgoing.  Finger to nose testing intact.  Gait normal, Romberg negative. ? ? ?Shon Millet, DO ? ?CC: *** ? ? ? ? ? ? ?

## 2021-08-25 ENCOUNTER — Ambulatory Visit: Payer: Self-pay | Admitting: Neurology

## 2021-09-24 ENCOUNTER — Other Ambulatory Visit: Payer: Self-pay

## 2021-09-24 ENCOUNTER — Emergency Department (HOSPITAL_COMMUNITY): Payer: Self-pay

## 2021-09-24 ENCOUNTER — Encounter (HOSPITAL_COMMUNITY): Payer: Self-pay | Admitting: Neurology

## 2021-09-24 ENCOUNTER — Inpatient Hospital Stay (HOSPITAL_COMMUNITY): Payer: Self-pay

## 2021-09-24 ENCOUNTER — Inpatient Hospital Stay (HOSPITAL_COMMUNITY)
Admission: EM | Admit: 2021-09-24 | Discharge: 2021-09-27 | DRG: 063 | Disposition: A | Discharge status: Home / Self Care | Payer: Self-pay | Attending: Neurology | Admitting: Neurology

## 2021-09-24 DIAGNOSIS — Z8673 Personal history of transient ischemic attack (TIA), and cerebral infarction without residual deficits: Secondary | ICD-10-CM

## 2021-09-24 DIAGNOSIS — H814 Vertigo of central origin: Principal | ICD-10-CM

## 2021-09-24 DIAGNOSIS — Z8661 Personal history of infections of the central nervous system: Secondary | ICD-10-CM

## 2021-09-24 DIAGNOSIS — E785 Hyperlipidemia, unspecified: Secondary | ICD-10-CM | POA: Diagnosis present

## 2021-09-24 DIAGNOSIS — E669 Obesity, unspecified: Secondary | ICD-10-CM | POA: Diagnosis present

## 2021-09-24 DIAGNOSIS — I639 Cerebral infarction, unspecified: Secondary | ICD-10-CM

## 2021-09-24 DIAGNOSIS — R29701 NIHSS score 1: Secondary | ICD-10-CM | POA: Diagnosis present

## 2021-09-24 DIAGNOSIS — G459 Transient cerebral ischemic attack, unspecified: Principal | ICD-10-CM | POA: Diagnosis present

## 2021-09-24 DIAGNOSIS — Z20822 Contact with and (suspected) exposure to covid-19: Secondary | ICD-10-CM | POA: Diagnosis present

## 2021-09-24 DIAGNOSIS — H5500 Unspecified nystagmus: Secondary | ICD-10-CM | POA: Diagnosis present

## 2021-09-24 DIAGNOSIS — I10 Essential (primary) hypertension: Secondary | ICD-10-CM | POA: Diagnosis present

## 2021-09-24 DIAGNOSIS — G458 Other transient cerebral ischemic attacks and related syndromes: Secondary | ICD-10-CM | POA: Diagnosis present

## 2021-09-24 DIAGNOSIS — Z6834 Body mass index (BMI) 34.0-34.9, adult: Secondary | ICD-10-CM

## 2021-09-24 DIAGNOSIS — Z5900 Homelessness unspecified: Secondary | ICD-10-CM

## 2021-09-24 DIAGNOSIS — I6389 Other cerebral infarction: Secondary | ICD-10-CM

## 2021-09-24 LAB — CBC
HCT: 43.3 % (ref 39.0–52.0)
Hemoglobin: 14.7 g/dL (ref 13.0–17.0)
MCH: 29.9 pg (ref 26.0–34.0)
MCHC: 33.9 g/dL (ref 30.0–36.0)
MCV: 88 fL (ref 80.0–100.0)
Platelets: 205 10*3/uL (ref 150–400)
RBC: 4.92 MIL/uL (ref 4.22–5.81)
RDW: 12.6 % (ref 11.5–15.5)
WBC: 8.2 10*3/uL (ref 4.0–10.5)
nRBC: 0 % (ref 0.0–0.2)

## 2021-09-24 LAB — URINALYSIS, ROUTINE W REFLEX MICROSCOPIC
Bilirubin Urine: NEGATIVE
Glucose, UA: NEGATIVE mg/dL
Hgb urine dipstick: NEGATIVE
Ketones, ur: NEGATIVE mg/dL
Leukocytes,Ua: NEGATIVE
Nitrite: NEGATIVE
Protein, ur: NEGATIVE mg/dL
Specific Gravity, Urine: 1.031 — ABNORMAL HIGH (ref 1.005–1.030)
pH: 7 (ref 5.0–8.0)

## 2021-09-24 LAB — LIPID PANEL
Cholesterol: 189 mg/dL (ref 0–200)
HDL: 28 mg/dL — ABNORMAL LOW (ref >40)
LDL Cholesterol: 136 mg/dL — ABNORMAL HIGH (ref 0–99)
Total CHOL/HDL Ratio: 6.8 RATIO
Triglycerides: 124 mg/dL (ref <150)
VLDL: 25 mg/dL (ref 0–40)

## 2021-09-24 LAB — PROTIME-INR
INR: 1 (ref 0.8–1.2)
Prothrombin Time: 12.8 seconds (ref 11.4–15.2)

## 2021-09-24 LAB — ECHOCARDIOGRAM COMPLETE
AR max vel: 1.69 cm2
AV Peak grad: 13.2 mmHg
Ao pk vel: 1.82 m/s
Area-P 1/2: 3.03 cm2
S' Lateral: 3 cm
Weight: 3897.73 oz

## 2021-09-24 LAB — DIFFERENTIAL
Abs Immature Granulocytes: 0.06 10*3/uL (ref 0.00–0.07)
Basophils Absolute: 0.1 10*3/uL (ref 0.0–0.1)
Basophils Relative: 1 %
Eosinophils Absolute: 0.1 10*3/uL (ref 0.0–0.5)
Eosinophils Relative: 1 %
Immature Granulocytes: 1 %
Lymphocytes Relative: 18 %
Lymphs Abs: 1.4 10*3/uL (ref 0.7–4.0)
Monocytes Absolute: 0.5 10*3/uL (ref 0.1–1.0)
Monocytes Relative: 6 %
Neutro Abs: 6.1 10*3/uL (ref 1.7–7.7)
Neutrophils Relative %: 73 %

## 2021-09-24 LAB — I-STAT CHEM 8, ED
BUN: 17 mg/dL (ref 8–23)
Calcium, Ion: 1.19 mmol/L (ref 1.15–1.40)
Chloride: 100 mmol/L (ref 98–111)
Creatinine, Ser: 0.9 mg/dL (ref 0.61–1.24)
Glucose, Bld: 130 mg/dL — ABNORMAL HIGH (ref 70–99)
HCT: 44 % (ref 39.0–52.0)
Hemoglobin: 15 g/dL (ref 13.0–17.0)
Potassium: 3.7 mmol/L (ref 3.5–5.1)
Sodium: 139 mmol/L (ref 135–145)
TCO2: 26 mmol/L (ref 22–32)

## 2021-09-24 LAB — COMPREHENSIVE METABOLIC PANEL
ALT: 24 U/L (ref 0–44)
AST: 17 U/L (ref 15–41)
Albumin: 3.8 g/dL (ref 3.5–5.0)
Alkaline Phosphatase: 45 U/L (ref 38–126)
Anion gap: 9 (ref 5–15)
BUN: 15 mg/dL (ref 8–23)
CO2: 25 mmol/L (ref 22–32)
Calcium: 9.1 mg/dL (ref 8.9–10.3)
Chloride: 102 mmol/L (ref 98–111)
Creatinine, Ser: 0.98 mg/dL (ref 0.61–1.24)
GFR, Estimated: >60 mL/min (ref >60)
Glucose, Bld: 129 mg/dL — ABNORMAL HIGH (ref 70–99)
Potassium: 3.6 mmol/L (ref 3.5–5.1)
Sodium: 136 mmol/L (ref 135–145)
Total Bilirubin: 0.8 mg/dL (ref 0.3–1.2)
Total Protein: 6.6 g/dL (ref 6.5–8.1)

## 2021-09-24 LAB — ETHANOL: Alcohol, Ethyl (B): <10 mg/dL (ref <10)

## 2021-09-24 LAB — RAPID URINE DRUG SCREEN, HOSP PERFORMED
Amphetamines: NOT DETECTED
Barbiturates: NOT DETECTED
Benzodiazepines: NOT DETECTED
Cocaine: NOT DETECTED
Opiates: NOT DETECTED
Tetrahydrocannabinol: NOT DETECTED

## 2021-09-24 LAB — GLUCOSE, CAPILLARY
Glucose-Capillary: 130 mg/dL — ABNORMAL HIGH (ref 70–99)
Glucose-Capillary: 106 mg/dL — ABNORMAL HIGH (ref 70–99)
Glucose-Capillary: 131 mg/dL — ABNORMAL HIGH (ref 70–99)
Glucose-Capillary: 138 mg/dL — ABNORMAL HIGH (ref 70–99)
Glucose-Capillary: 128 mg/dL — ABNORMAL HIGH (ref 70–99)

## 2021-09-24 LAB — HEMOGLOBIN A1C
Hgb A1c MFr Bld: 5.9 % — ABNORMAL HIGH (ref 4.8–5.6)
Mean Plasma Glucose: 122.63 mg/dL

## 2021-09-24 LAB — APTT: aPTT: 22 seconds — ABNORMAL LOW (ref 24–36)

## 2021-09-24 LAB — MRSA NEXT GEN BY PCR, NASAL: MRSA by PCR Next Gen: NOT DETECTED

## 2021-09-24 LAB — RESP PANEL BY RT-PCR (FLU A&B, COVID) ARPGX2
Influenza A by PCR: NEGATIVE
Influenza B by PCR: NEGATIVE
SARS Coronavirus 2 by RT PCR: NEGATIVE

## 2021-09-24 IMAGING — CT CT ANGIO HEAD-NECK (W OR W/O PERF)
2 of 7 series · 8 of 33 positions shown · IV contrast (OMNI 350)
Comparison: CT head [DATE] and [DATE].

CLINICAL DATA: Posterior circulation stroke suspected

EXAM:
CT ANGIOGRAPHY HEAD AND NECK
TECHNIQUE: Multidetector CT imaging of the head and neck was performed using
the standard protocol during bolus administration of intravenous
contrast. Multiplanar CT image reconstructions and MIPs were
obtained to evaluate the vascular anatomy. Carotid stenosis
measurements (when applicable) are obtained utilizing NASCET
criteria, using the distal internal carotid diameter as the
denominator.

[Series 5: cta neck · axial · 0.53mm/px · z∈[-282,-164]mm · 2 of 179 slices shown]
[im 60/179  soft-tissue]
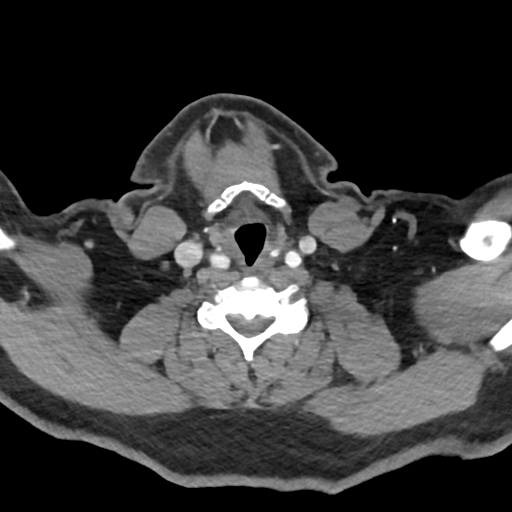
[im 119/179  soft-tissue]
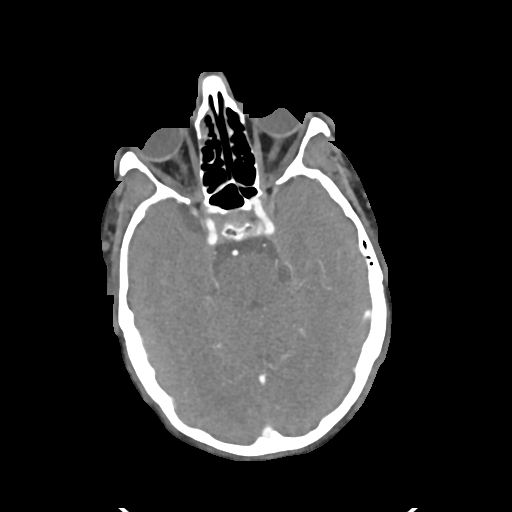

[Series 7: cta neck axial · axial · 0.43mm/px · z∈[-348,-94]mm · 6 of 356 slices shown]
[im 51/356  soft-tissue]
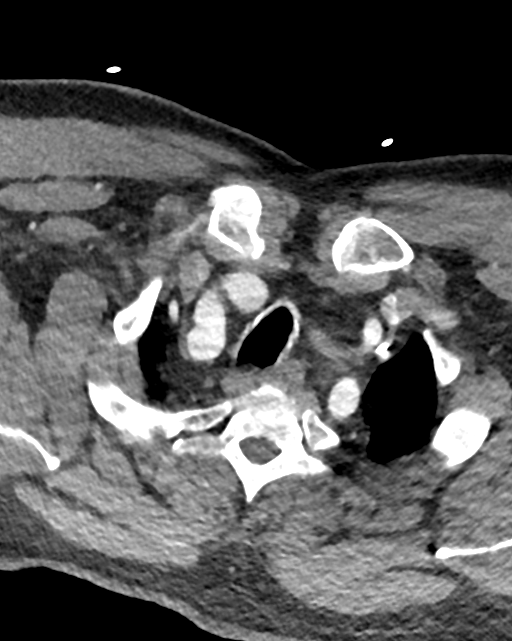
[im 102/356  bone]
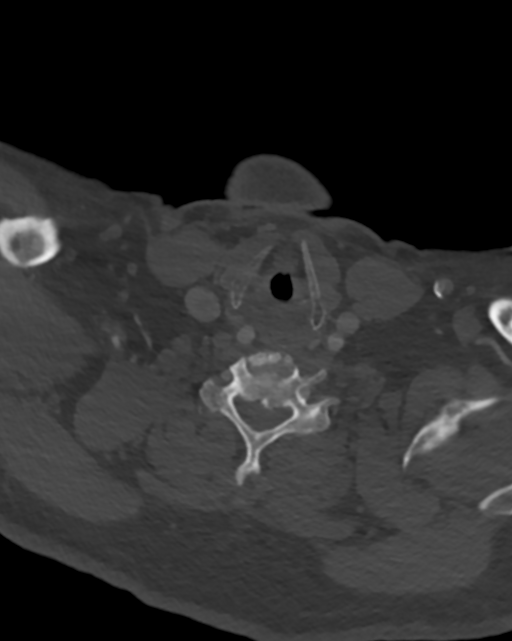
[im 153/356  soft-tissue]
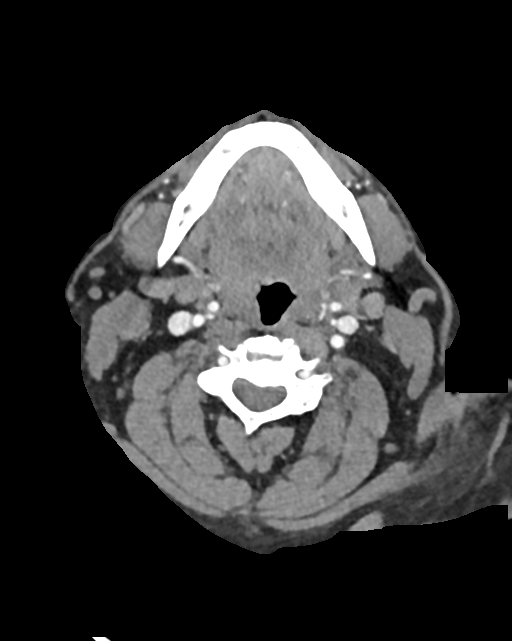
[im 203/356  bone]
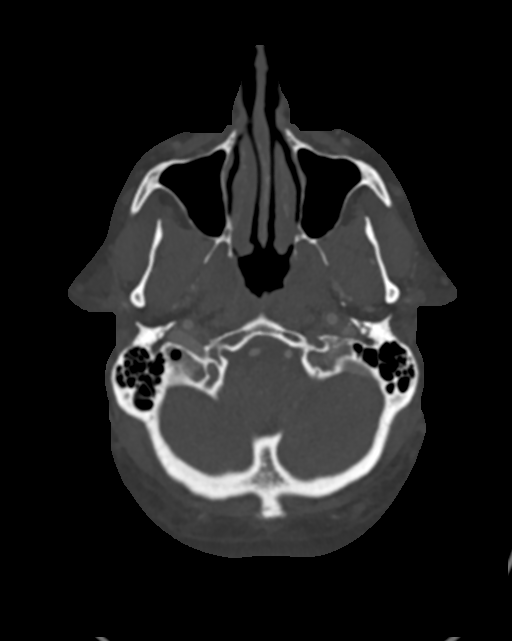
[im 254/356  soft-tissue]
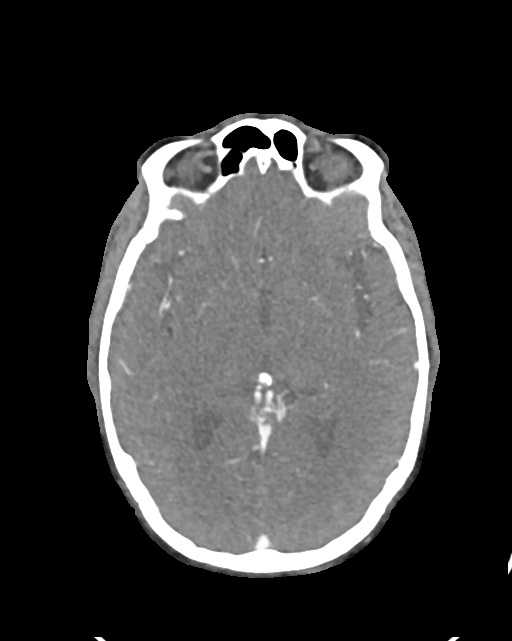
[im 305/356  bone]
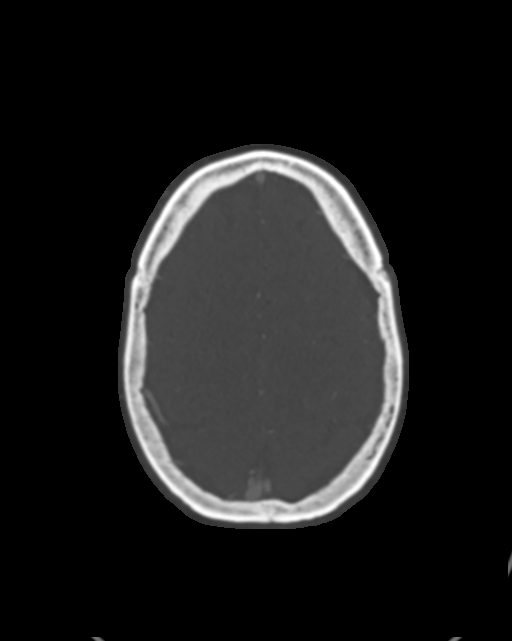

[8 of 33 positions shown; findings below may reference images not displayed]

RADIATION DOSE REDUCTION: This exam was performed according to the
departmental dose-optimization program which includes automated
exposure control, adjustment of the mA and/or kV according to
patient size and/or use of iterative reconstruction technique.

CONTRAST:  60mL OMNIPAQUE IOHEXOL 350 MG/ML SOLN
FINDINGS: CT HEAD FINDINGS

For noncontrast findings, please see same day CT head.

CTA NECK FINDINGS

Aortic arch: Standard branching. Imaged portion shows no evidence of
aneurysm or dissection. No significant stenosis of the major arch
vessel origins.

Right carotid system: No evidence of dissection, stenosis (50% or
greater) or occlusion. Retropharyngeal course of the right common
carotid artery.

Left carotid system: No evidence of dissection, stenosis (50% or
greater) or occlusion.

Vertebral arteries: No evidence of dissection, stenosis (50% or
greater) or occlusion.

Skeleton: No acute osseous abnormality. Degenerative changes in the
cervical spine.

Other neck: Negative.

Upper chest: No focal pulmonary opacity or pleural effusion.

Review of the MIP images confirms the above findings

CTA HEAD FINDINGS

Anterior circulation: Both internal carotid arteries are patent to
the termini, without significant stenosis.

A1 segments patent. Normal anterior communicating artery. Anterior
cerebral arteries are patent to their distal aspects.

No M1 stenosis or occlusion. Normal MCA bifurcations. Distal MCA
branches perfused and symmetric.

Posterior circulation: Vertebral arteries patent to the
vertebrobasilar junction without stenosis. Posterior inferior
cerebral arteries are not definitively visualized bilaterally.

Basilar patent to its distal aspect. Superior cerebellar arteries
patent bilaterally.

Patent P1 segments. Mild narrowing in the left P3 (series 7, image
114). PCAs otherwise perfused to their distal aspects without
stenosis. The bilateral posterior communicating arteries are not
visualized.

Venous sinuses: As permitted by contrast timing, patent.

Anatomic variants: None significant.

Review of the MIP images confirms the above findings
IMPRESSION: 1. No intracranial large vessel occlusion. The PICAs are not
definitively visualized.
2.  No hemodynamically significant stenosis in the neck.

Code stroke imaging results were communicated on [DATE] at [DATE] to provider Dr. BLU via secure text paging.

## 2021-09-24 IMAGING — MR MR HEAD W/O CM
12 of 13 series · 44 of 48 positions shown · non-contrast
Comparison: CT head [DATE]

CLINICAL DATA: Acute neuro deficit.  Posterior MEWANAND.

EXAM:
MRI HEAD WITHOUT CONTRAST
TECHNIQUE: Multiplanar, multiecho pulse sequences of the brain and surrounding
structures were obtained without intravenous contrast.

[Series 5: DWI · axial · 3.0mm · 0.88mm/px · z∈[-123,+25]mm · 8 of 102 slices shown (1 of 4)]
[im 1/102]
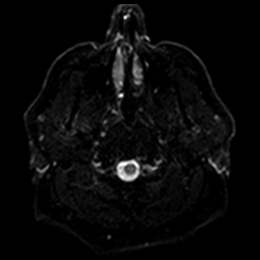
[im 15/102]
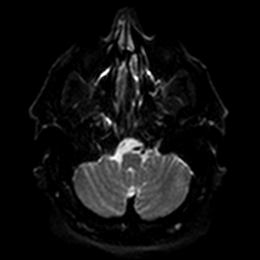
[im 29/102]
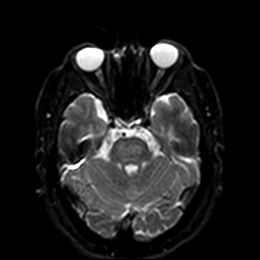
[im 44/102]
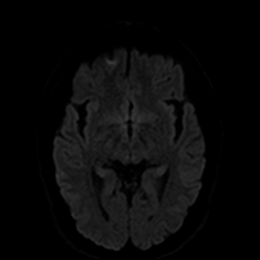
[im 58/102]
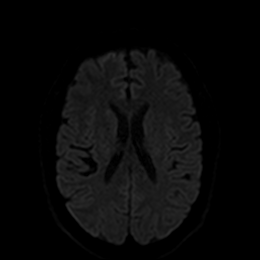
[im 73/102]
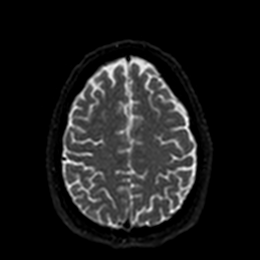
[im 87/102]
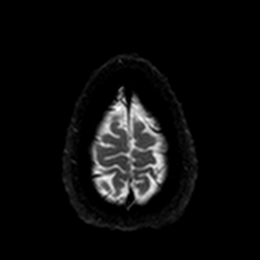
[im 102/102]
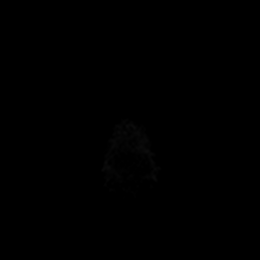

[Series 6: DWI · axial · 3.0mm · 0.88mm/px · z∈[-123,+25]mm · 4 of 51 slices shown (2 of 4)]
[im 1/51]
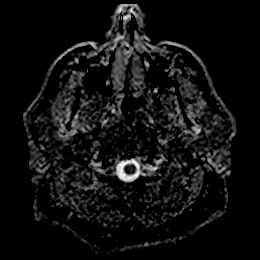
[im 17/51]
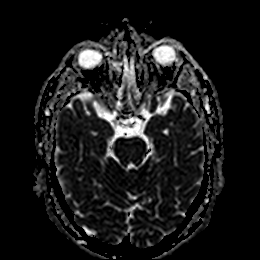
[im 34/51]
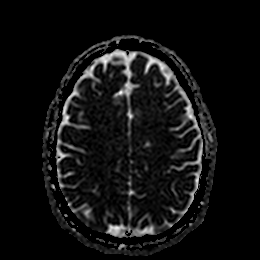
[im 51/51]
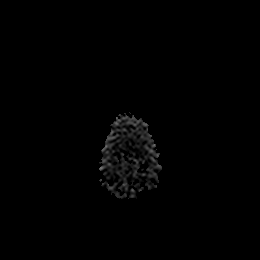

[Series 7: DWI · coronal · 4.0mm · 0.88mm/px · 6 of 76 slices shown (3 of 4)]
[im 1/76]
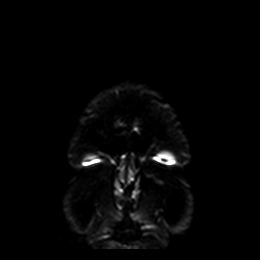
[im 16/76]
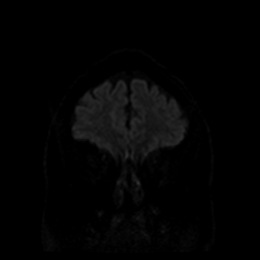
[im 31/76]
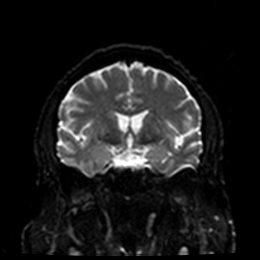
[im 46/76]
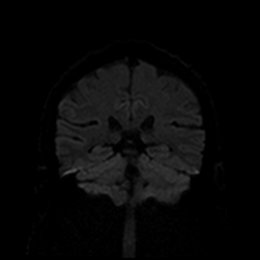
[im 61/76]
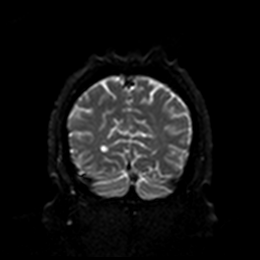
[im 76/76]
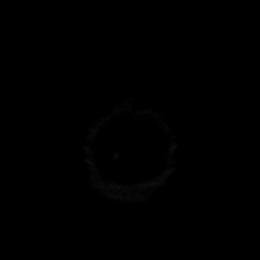

[Series 8: DWI · coronal · 4.0mm · 0.88mm/px · 3 of 38 slices shown (4 of 4)]
[im 1/38]
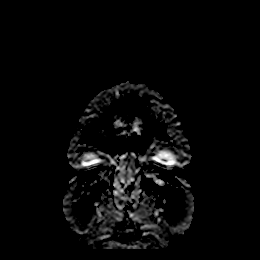
[im 19/38]
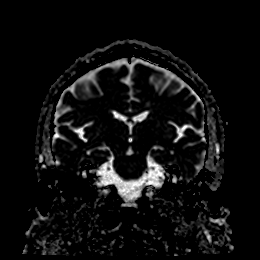
[im 38/38]
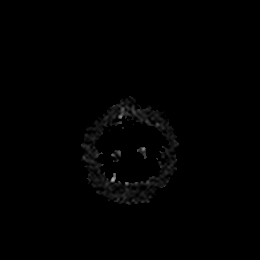

[Series 9: T1 · sagittal · 5.0mm · 0.75mm/px · 2 of 25 slices shown]
[im 1/25]
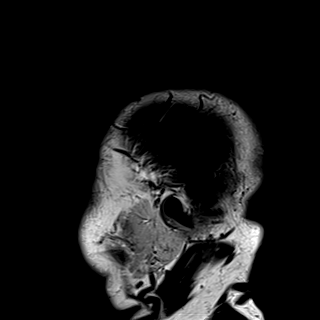
[im 25/25]
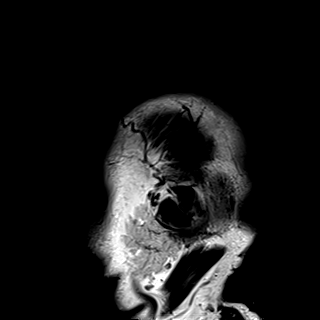

[Series 10: T2 · axial · 5.0mm · 0.72mm/px · z∈[-126,+28]mm · 2 of 27 slices shown (1 of 2)]
[im 1/27]
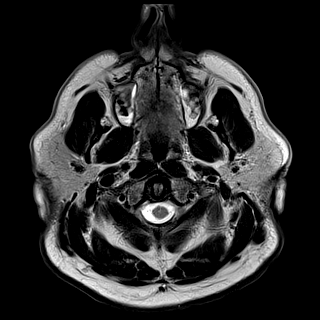
[im 27/27]
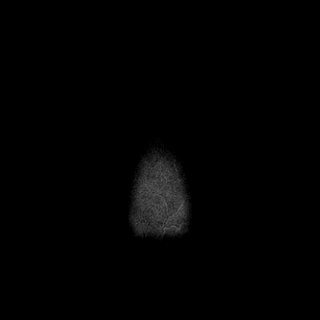

[Series 11: FLAIR · axial · 5.0mm · 0.45mm/px · z∈[-125,+29]mm · 2 of 27 slices shown]
[im 1/27]
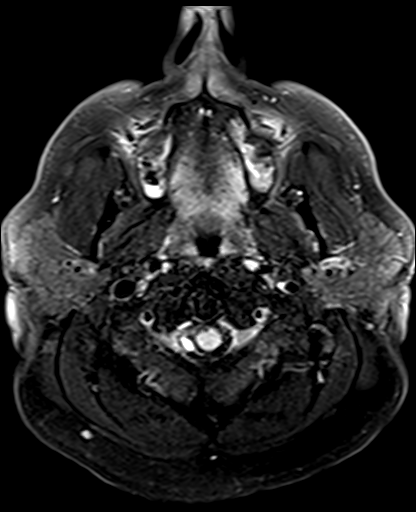
[im 27/27]
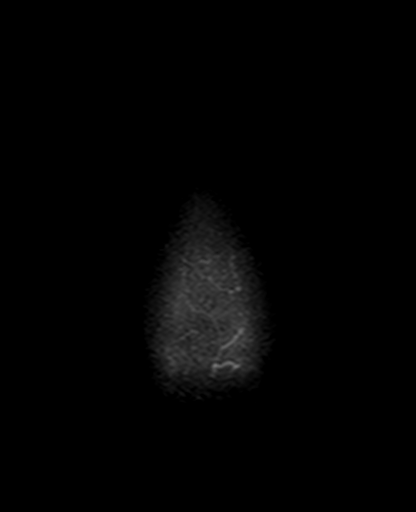

[Series 12: mag_images · axial · 3.0mm · 0.90mm/px · z∈[-124,+28]mm · 4 of 52 slices shown]
[im 1/52]
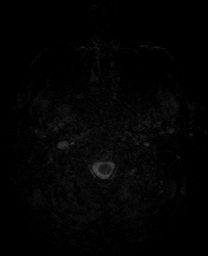
[im 18/52]
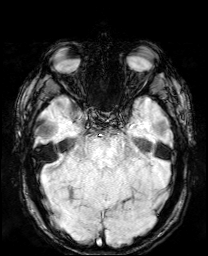
[im 35/52]
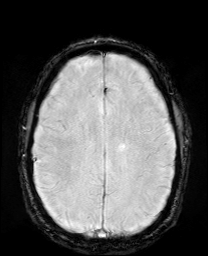
[im 52/52]
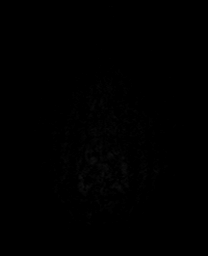

[Series 13: pha_images · axial · 3.0mm · 0.90mm/px · z∈[-124,+25]mm · 4 of 50 slices shown]
[im 1/50]
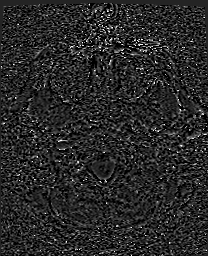
[im 17/50]
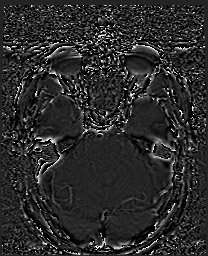
[im 33/50]
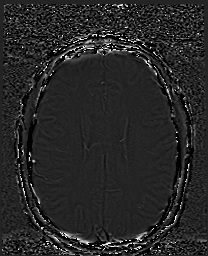
[im 50/50]
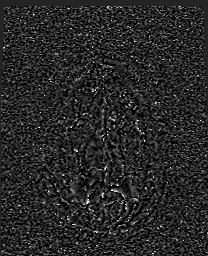

[Series 14: swi_images · axial · 3.0mm · 0.90mm/px · z∈[-124,+28]mm · 4 of 52 slices shown]
[im 1/52]
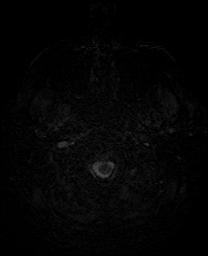
[im 18/52]
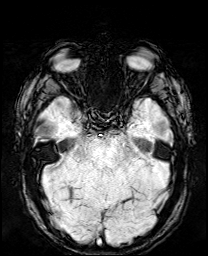
[im 35/52]
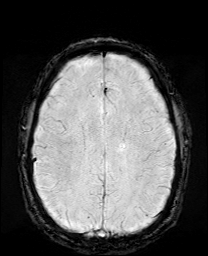
[im 52/52]
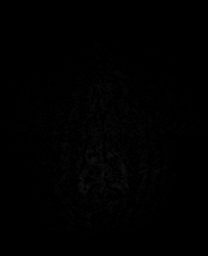

[Series 15: mip_images(sw) · axial · 24.0mm · 0.90mm/px · z∈[-113,+17]mm · 3 of 45 slices shown]
[im 1/45]
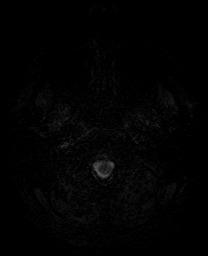
[im 23/45]
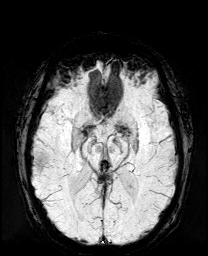
[im 45/45]
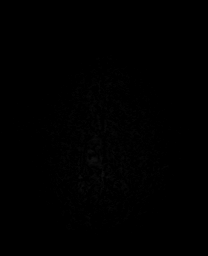

[Series 17: T2 · coronal · 5.0mm · 0.34mm/px · 2 of 32 slices shown (2 of 2)]
[im 1/32]
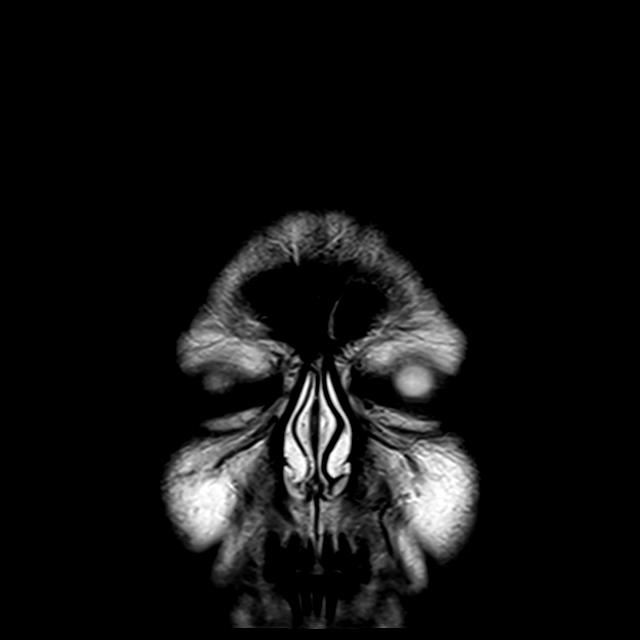
[im 32/32]
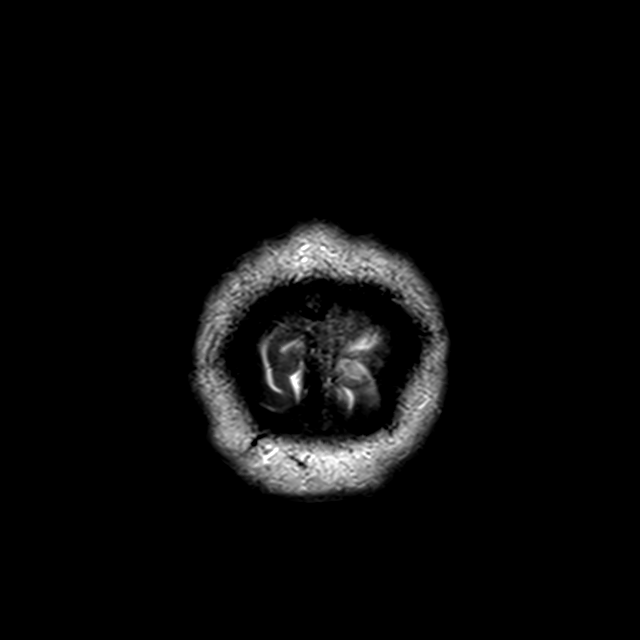

[44 of 48 positions shown; findings below may reference images not displayed]

FINDINGS: Brain: No acute infarction, hemorrhage, hydrocephalus, extra-axial
collection or mass lesion. Very mild white matter changes. Mild
patchy hyperintensity in the pons.

Vascular: Normal arterial flow voids.

Skull and upper cervical spine: Negative

Sinuses/Orbits: Mucosal edema paranasal sinuses.  Negative orbit

Other: None
IMPRESSION: Negative for acute infarct. Mild chronic microvascular ischemic
change.

Sinus mucosal edema.

## 2021-09-24 IMAGING — CT CT HEAD CODE STROKE
3 series · 15 of 47 positions shown, 18 images · non-contrast
Comparison: [DATE]

CLINICAL DATA: Code stroke.



[Series 2: head 5.0 st · axial · 0.48mm/px · z∈[-222,-72]mm · 9 of 36 slices shown, 12 images]
[im 3/36  brain]
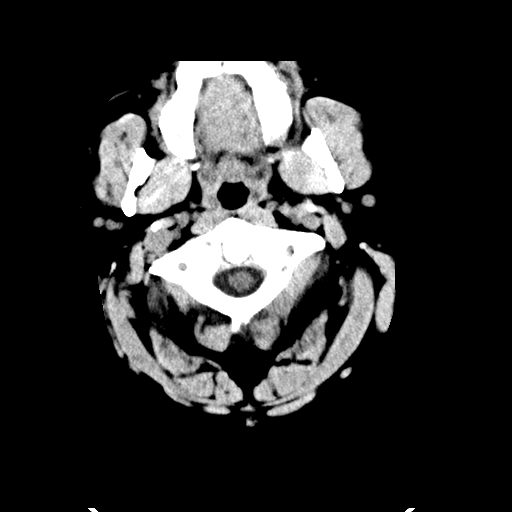
[im 3/36  bone]
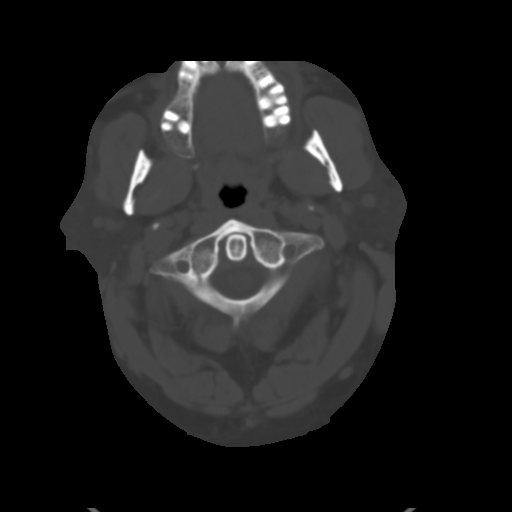
[im 7/36  brain]
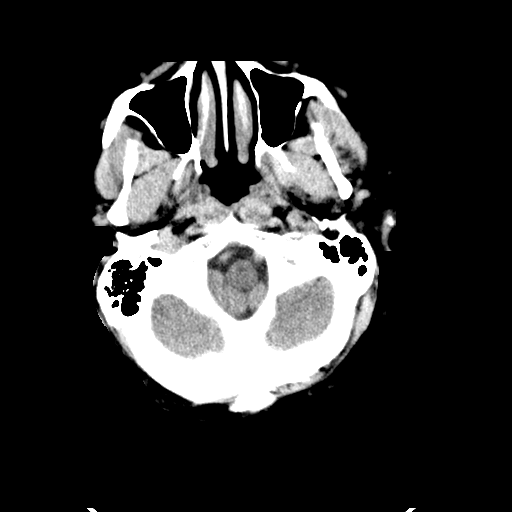
[im 10/36  brain]
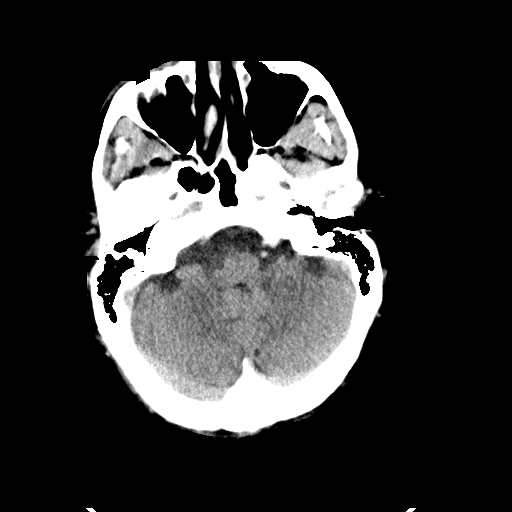
[im 14/36  brain]
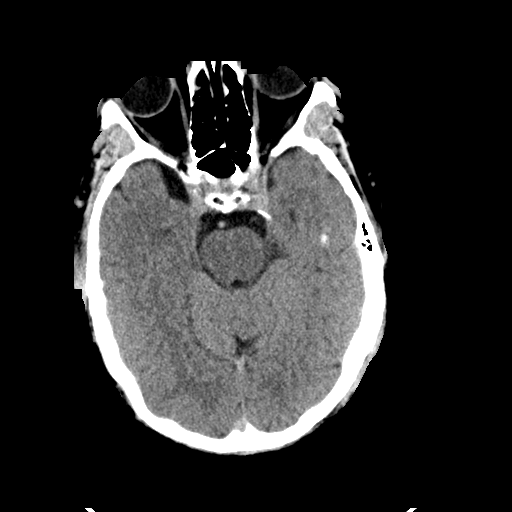
[im 19/36  brain]
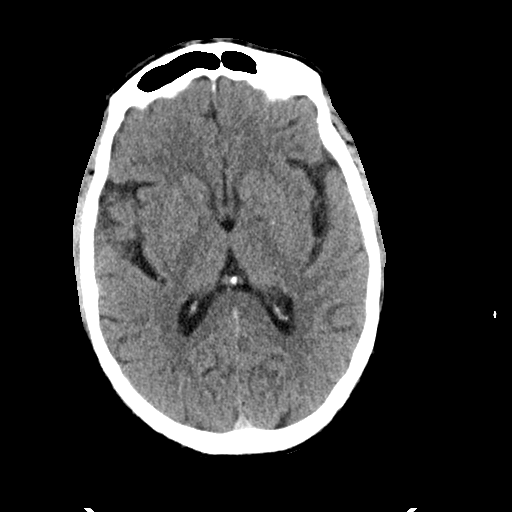
[im 19/36  bone]
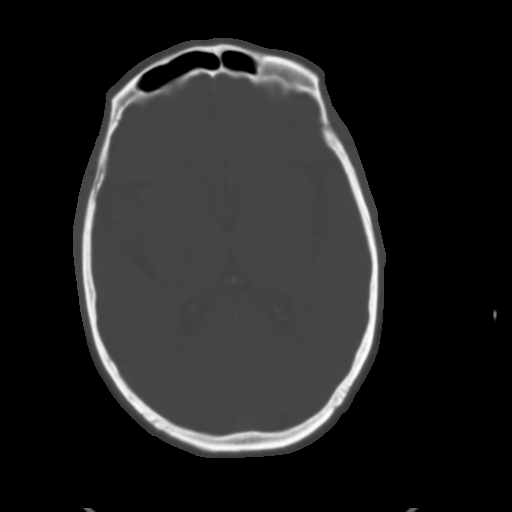
[im 22/36  brain]
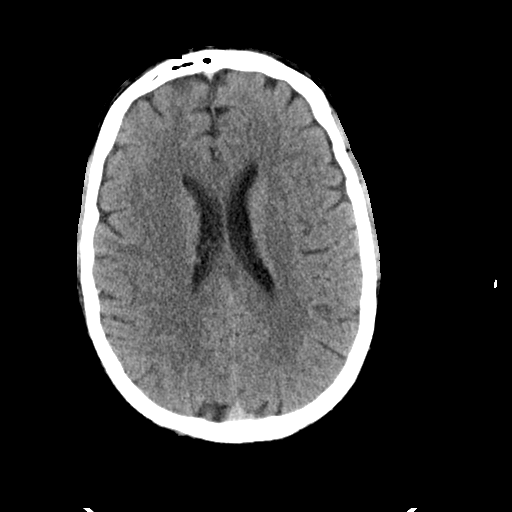
[im 26/36  brain]
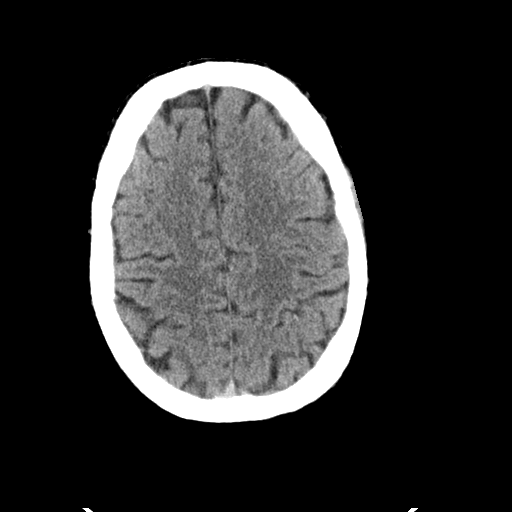
[im 29/36  brain]
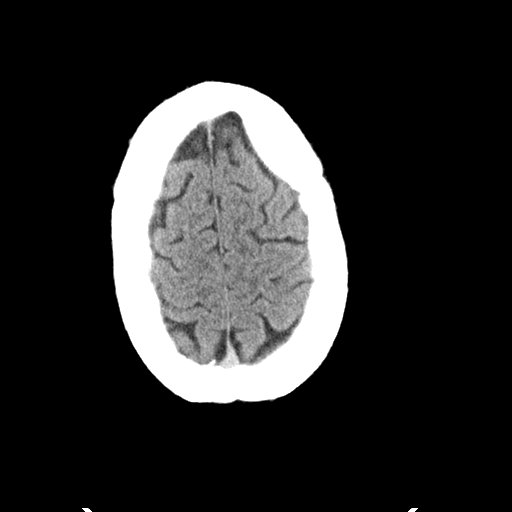
[im 33/36  brain]
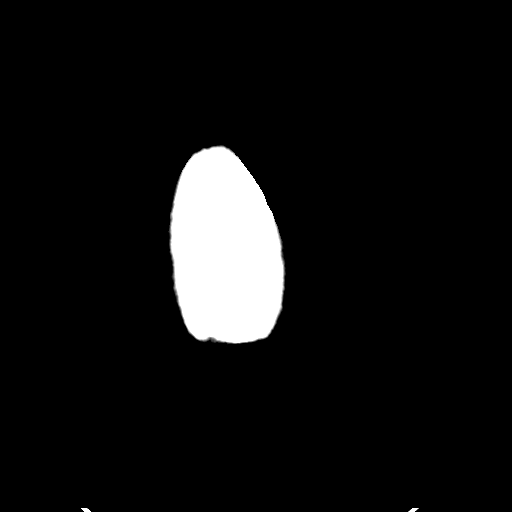
[im 33/36  bone]
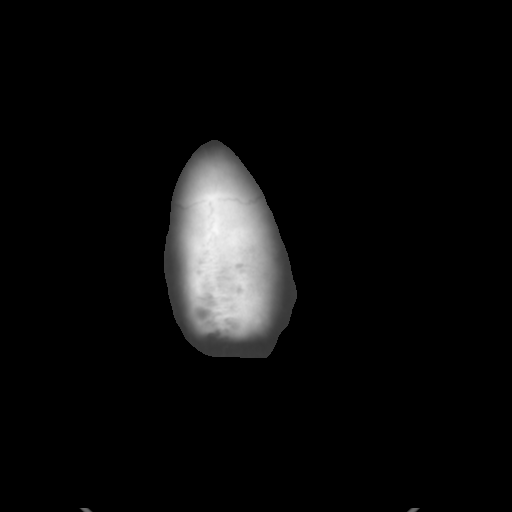

[Series 5: head 3.0 cor st · coronal · 0.34mm/px · 3 of 76 slices shown]
[im 26/76  brain]
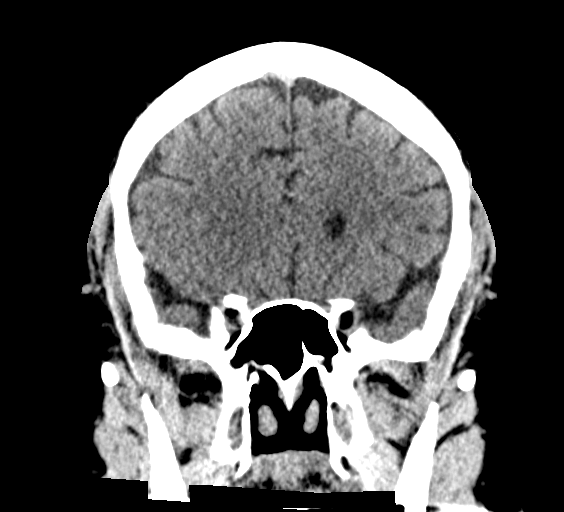
[im 34/76  brain]
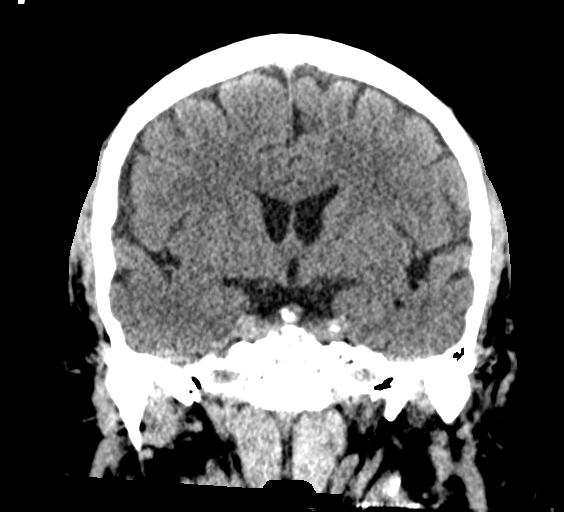
[im 42/76  brain]
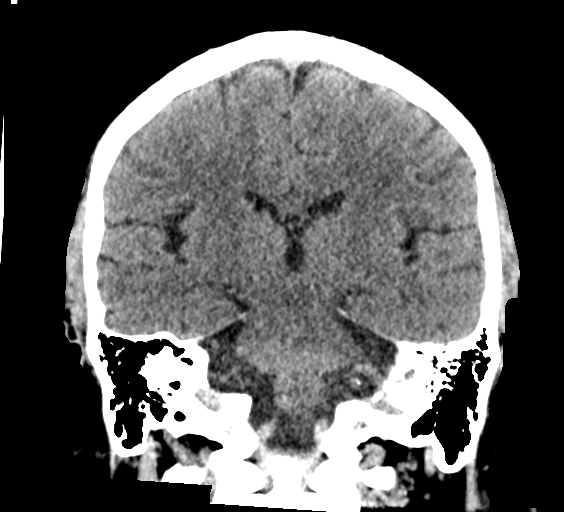

[Series 6: head 3.0 sag st · sagittal · 0.34mm/px · 3 of 67 slices shown]
[im 23/67  brain]
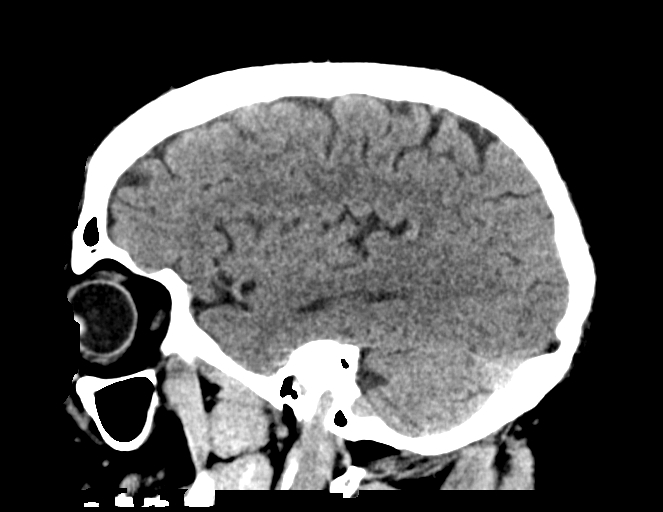
[im 34/67  brain]
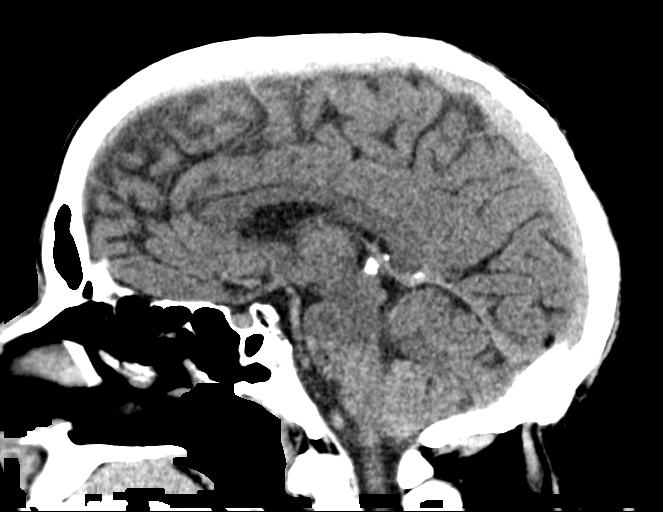
[im 45/67  brain]
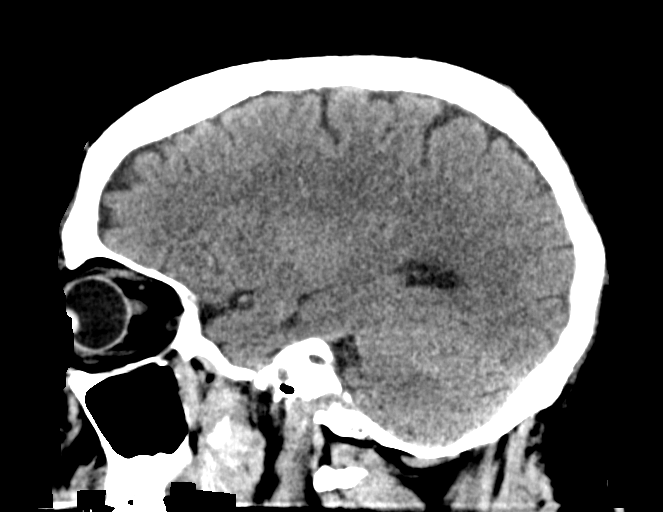

[15 of 47 positions shown; findings below may reference images not displayed]

FINDINGS: Brain: No evidence of acute infarction, hemorrhage, cerebral edema,
mass, mass effect, or midline shift. Ventricles and sulci are normal
for age. No extra-axial fluid collection.

Vascular: No hyperdense vessel.

Skull: Normal. Negative for fracture or focal lesion.

Sinuses/Orbits: Mild mucosal thickening in the maxillary sinuses and
ethmoid air cells.

Other: The mastoid air cells are well aerated.

ASPECTS ([place] Stroke Program Early CT Score)

- Ganglionic level infarction (caudate, lentiform nuclei, internal
capsule, insula, M1-M3 cortex): 7

- Supraganglionic infarction (M4-M6 cortex): 3

Total score (0-10 with 10 being normal): 10
IMPRESSION: 1. No acute intracranial process.
2. ASPECTS is 10

Code stroke imaging results were communicated on [DATE] at [DATE] to provider Dr. ORICH via secure text paging.

## 2021-09-24 MED ORDER — HYDROXYZINE HCL 25 MG PO TABS: 25.0000 mg | ORAL_TABLET | Freq: Three times a day (TID) | ORAL | Status: DC | PRN

## 2021-09-24 MED ORDER — PANTOPRAZOLE SODIUM 40 MG PO TBEC
40.0000 mg | DELAYED_RELEASE_TABLET | Freq: Every day | ORAL | Status: DC
  Administered 2021-09-24 – 2021-09-26 (×3): 40 mg via ORAL
  Filled 2021-09-24 (×3): qty 1

## 2021-09-24 MED ORDER — ROSUVASTATIN CALCIUM 20 MG PO TABS
20.0000 mg | ORAL_TABLET | Freq: Every day | ORAL | Status: DC
  Administered 2021-09-24 – 2021-09-27 (×4): 20 mg via ORAL
  Filled 2021-09-24 (×4): qty 1

## 2021-09-24 MED ORDER — ACETAMINOPHEN 325 MG PO TABS
650.0000 mg | ORAL_TABLET | ORAL | Status: DC | PRN
  Administered 2021-09-26 – 2021-09-27 (×4): 650 mg via ORAL
  Filled 2021-09-24 (×4): qty 2

## 2021-09-24 MED ORDER — PROCHLORPERAZINE EDISYLATE 10 MG/2ML IJ SOLN
10.0000 mg | Freq: Once | INTRAMUSCULAR | Status: AC
  Administered 2021-09-24: 10 mg via INTRAVENOUS
  Filled 2021-09-24: qty 2

## 2021-09-24 MED ORDER — SODIUM CHLORIDE 0.9 % IV SOLN: INTRAVENOUS | Status: AC

## 2021-09-24 MED ORDER — SENNOSIDES-DOCUSATE SODIUM 8.6-50 MG PO TABS: 1.0000 | ORAL_TABLET | Freq: Every evening | ORAL | Status: DC | PRN

## 2021-09-24 MED ORDER — CHLORHEXIDINE GLUCONATE CLOTH 2 % EX PADS
6.0000 | MEDICATED_PAD | Freq: Every day | CUTANEOUS | Status: DC
  Administered 2021-09-24 – 2021-09-27 (×4): 6 via TOPICAL

## 2021-09-24 MED ORDER — ACETAMINOPHEN 160 MG/5ML PO SOLN: 650.0000 mg | ORAL | Status: DC | PRN

## 2021-09-24 MED ORDER — TENECTEPLASE FOR STROKE
25.0000 mg | PACK | Freq: Once | INTRAVENOUS | Status: AC
  Administered 2021-09-24: 25 mg via INTRAVENOUS
  Filled 2021-09-24: qty 10

## 2021-09-24 MED ORDER — SODIUM CHLORIDE 0.9 % IV BOLUS
1000.0000 mL | Freq: Once | INTRAVENOUS | Status: AC
  Administered 2021-09-24: 1000 mL via INTRAVENOUS

## 2021-09-24 MED ORDER — STROKE: EARLY STAGES OF RECOVERY BOOK
Freq: Once | Status: DC
  Filled 2021-09-24: qty 1

## 2021-09-24 MED ORDER — TAMSULOSIN HCL 0.4 MG PO CAPS
0.4000 mg | ORAL_CAPSULE | Freq: Every day | ORAL | Status: DC
  Administered 2021-09-24 – 2021-09-26 (×3): 0.4 mg via ORAL
  Filled 2021-09-24 (×3): qty 1

## 2021-09-24 MED ORDER — TRAZODONE HCL 50 MG PO TABS
50.0000 mg | ORAL_TABLET | Freq: Every evening | ORAL | Status: DC | PRN
  Administered 2021-09-26: 50 mg via ORAL
  Filled 2021-09-24: qty 1

## 2021-09-24 MED ORDER — CITALOPRAM HYDROBROMIDE 10 MG PO TABS
20.0000 mg | ORAL_TABLET | Freq: Every day | ORAL | Status: DC
  Administered 2021-09-24 – 2021-09-27 (×4): 20 mg via ORAL
  Filled 2021-09-24: qty 1
  Filled 2021-09-24: qty 2
  Filled 2021-09-24 (×2): qty 1
  Filled 2021-09-24: qty 2

## 2021-09-24 MED ORDER — HYDROXYZINE PAMOATE 25 MG PO CAPS: 25.0000 mg | ORAL_CAPSULE | Freq: Three times a day (TID) | ORAL | Status: DC | PRN

## 2021-09-24 MED ORDER — HYDROCHLOROTHIAZIDE 25 MG PO TABS
50.0000 mg | ORAL_TABLET | Freq: Every day | ORAL | Status: DC
  Administered 2021-09-24 – 2021-09-27 (×4): 50 mg via ORAL
  Filled 2021-09-24: qty 2
  Filled 2021-09-24: qty 1
  Filled 2021-09-24: qty 2
  Filled 2021-09-24 (×2): qty 1

## 2021-09-24 MED ORDER — IOHEXOL 350 MG/ML SOLN
60.0000 mL | Freq: Once | INTRAVENOUS | Status: AC | PRN
  Administered 2021-09-24: 60 mL via INTRAVENOUS

## 2021-09-24 MED ORDER — PANTOPRAZOLE SODIUM 40 MG IV SOLR: 40.0000 mg | Freq: Every day | INTRAVENOUS | Status: DC

## 2021-09-24 MED ORDER — DIPHENHYDRAMINE HCL 50 MG/ML IJ SOLN
25.0000 mg | Freq: Once | INTRAMUSCULAR | Status: AC
  Administered 2021-09-24: 25 mg via INTRAVENOUS
  Filled 2021-09-24: qty 1

## 2021-09-24 MED ORDER — ACETAMINOPHEN 650 MG RE SUPP: 650.0000 mg | RECTAL | Status: DC | PRN

## 2021-09-24 MED ORDER — ORAL CARE MOUTH RINSE
15.0000 mL | Freq: Two times a day (BID) | OROMUCOSAL | Status: DC
  Administered 2021-09-24 – 2021-09-27 (×7): 15 mL via OROMUCOSAL

## 2021-09-24 NOTE — TOC CAGE-AID Note (Signed)
Transition of Care (TOC) - CAGE-AID Screening ? ? ?Patient Details  ?Name: Allen Cooke ?MRN: [id number] ?Date of Birth: 01-06-1961 ? ?Transition of Care (TOC) CM/SW Contact:    ?Jazlynne Milliner Aris Lot, LCSW ?Phone Number: ?09/24/2021, 2:45 PM ? ? ?Clinical Narrative: ? ?CAGE-AID completed with score of 3. Pt reports having a history of primarily alcohol use though does state that he has used marijuana and amphetamine before in the past. Pt reports that he doesn't currently drink alcohol or use any other substance and that he hasn't since he went to residential treatment at [facility] in December 2022. Pt reports he was [facility] to [facility] after [facility] from [facility] though it is unclear if he participates in outpatient substance use services. Pt explains he has a case Production designer, theatre/television/film at [employer] who is assisting in finding housing. He is currently un-housed and states he stays at "different places." Pt reports he is familiar with [facility], local shelters, and other homelessness resources. He states he participates some in a peer support recovery community. Pt does not require additional resources at this time. He does not have transportation and may need assistance/bus passes at [place].  ? ?CAGE-AID Screening: ?  ? ?Have You Ever Felt You Ought to Cut Down on Your Drinking or Drug Use?: Yes ?Have People Annoyed You By Critizing Your Drinking Or Drug Use?: No ?Have You Felt Bad Or Guilty About Your Drinking Or Drug Use?: Yes ?Have You Ever Had a Drink or Used Drugs First Thing In The Morning to Steady Your Nerves or to Get Rid of a Hangover?: Yes ?CAGE-AID Score: 3 ? ?Substance Abuse Education Offered: No ? ?  ? ? ? ? ? ? ?

## 2021-09-24 NOTE — Progress Notes (Signed)
Pt has 3 cell phones, a black cell phone charger, and clothing at bedside. ?

## 2021-09-24 NOTE — Progress Notes (Addendum)
STROKE TEAM PROGRESS NOTE  ? ?INTERVAL HISTORY ?Allen Cooke is a 61 y.o. 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 NIHSS of 1, but patient was unable to ambulate. CTH negative. Was given TNK. MRI not yet performed.  ? ?The patient is seen in his room this morning with no family at bedside. He confirms the history as above. Nurse reports a desat this morning when patient was moved from O2 to RA while sleeping. Patient states that he snores frequently. Patient has difficulty answering questions about his PMHx but states he has never been on anticoagulation or antiplatelet agents before. He denies illegal drug use.  ?Blood pressure adequately controlled.  Vital signs are stable. ?Vitals:  ? 09/24/21 0615 09/24/21 0630 09/24/21 0645 09/24/21 0700  ?BP: 112/62 117/64 121/82 127/73  ?Pulse: (!) 52 (!) 52 (!) 52 60  ?Resp: 12 11 13 14   ?Temp:      ?SpO2: 93% 94% 94% 95%  ?Weight:      ? ?CBC:  ?Recent Labs  ?Lab 09/24/21 ?0248 09/24/21 ?0253  ?WBC 8.2  --   ?NEUTROABS 6.1  --   ?HGB 14.7 15.0  ?HCT 43.3 44.0  ?MCV 88.0  --   ?PLT 205  --   ? ?Basic Metabolic Panel:  ?Recent Labs  ?Lab 09/24/21 ?0248 09/24/21 ?0253  ?NA 136 139  ?K 3.6 3.7  ?CL 102 100  ?CO2 25  --   ?GLUCOSE 129* 130*  ?BUN 15 17  ?CREATININE 0.98 0.90  ?CALCIUM 9.1  --   ? ?Lipid Panel:  ?Recent Labs  ?Lab 09/24/21 ?0248  ?CHOL 189  ?TRIG 124  ?HDL 28*  ?CHOLHDL 6.8  ?VLDL 25  ?LDLCALC 136*  ? ?HgbA1c:  ?Recent Labs  ?Lab 09/24/21 ?0248  ?HGBA1C 5.9*  ? ?Urine Drug Screen: No results for input(s): LABOPIA, COCAINSCRNUR, LABBENZ, AMPHETMU, THCU, LABBARB in the last 168 hours.  ?Alcohol Level  ?Recent Labs  ?Lab 09/24/21 ?0248  ?ETH <10  ? ? ?IMAGING past 24 hours ?CT HEAD CODE STROKE WO CONTRAST ? ?Result Date: 09/24/2021 ?CLINICAL DATA:  Code stroke. EXAM: CT HEAD WITHOUT CONTRAST TECHNIQUE: Contiguous axial  images were obtained from the base of the skull through the vertex without intravenous contrast. RADIATION DOSE REDUCTION: This exam was performed according to the departmental dose-optimization program which includes automated exposure control, adjustment of the mA and/or kV according to patient size and/or use of iterative reconstruction technique. COMPARISON:  01/16/2021 FINDINGS: Brain: No evidence of acute infarction, hemorrhage, cerebral edema, mass, mass effect, or midline shift. Ventricles and sulci are normal for age. No extra-axial fluid collection. Vascular: No hyperdense vessel. Skull: Normal. Negative for fracture or focal lesion. Sinuses/Orbits: Mild mucosal thickening in the maxillary sinuses and ethmoid air cells. Other: The mastoid air cells are well aerated. ASPECTS [facility] Stroke Program Early CT Score) - Ganglionic level infarction (caudate, lentiform nuclei, internal capsule, insula, M1-M3 cortex): 7 - Supraganglionic infarction (M4-M6 cortex): 3 Total score (0-10 with 10 being normal): 10 IMPRESSION: 1. No acute intracranial process. 2. ASPECTS is 10 Code stroke imaging results were communicated on 09/24/2021 at 2:12 am to provider Dr. Lorrin Goodell via secure text paging. Electronically Signed   By: Merilyn Baba M.D.   On: 09/24/2021 02:12  ? ?CT ANGIO HEAD NECK W WO CM (CODE STROKE) ? ?Result Date: 09/24/2021 ?CLINICAL DATA:  Posterior circulation stroke suspected EXAM:  CT ANGIOGRAPHY HEAD AND NECK TECHNIQUE: Multidetector CT imaging of the head and neck was performed using the standard protocol during bolus administration of intravenous contrast. Multiplanar CT image reconstructions and MIPs were obtained to evaluate the vascular anatomy. Carotid stenosis measurements (when applicable) are obtained utilizing NASCET criteria, using the distal internal carotid diameter as the denominator. RADIATION DOSE REDUCTION: This exam was performed according to the departmental dose-optimization program  which includes automated exposure control, adjustment of the mA and/or kV according to patient size and/or use of iterative reconstruction technique. CONTRAST:  69mL OMNIPAQUE IOHEXOL 350 MG/ML SOLN COMPARISON:  CT head 09/24/2021 and 01/16/2021. FINDINGS: CT HEAD FINDINGS For noncontrast findings, please see same day CT head. CTA NECK FINDINGS Aortic arch: Standard branching. Imaged portion shows no evidence of aneurysm or dissection. No significant stenosis of the major arch vessel origins. Right carotid system: No evidence of dissection, stenosis (50% or greater) or occlusion. Retropharyngeal course of the right common carotid artery. Left carotid system: No evidence of dissection, stenosis (50% or greater) or occlusion. Vertebral arteries: No evidence of dissection, stenosis (50% or greater) or occlusion. Skeleton: No acute osseous abnormality. Degenerative changes in the cervical spine. Other neck: Negative. Upper chest: No focal pulmonary opacity or pleural effusion. Review of the MIP images confirms the above findings CTA HEAD FINDINGS Anterior circulation: Both internal carotid arteries are patent to the termini, without significant stenosis. A1 segments patent. Normal anterior communicating artery. Anterior cerebral arteries are patent to their distal aspects. No M1 stenosis or occlusion. Normal MCA bifurcations. Distal MCA branches perfused and symmetric. Posterior circulation: Vertebral arteries patent to the vertebrobasilar junction without stenosis. Posterior inferior cerebral arteries are not definitively visualized bilaterally. Basilar patent to its distal aspect. Superior cerebellar arteries patent bilaterally. Patent P1 segments. Mild narrowing in the left P3 (series 7, image 114). PCAs otherwise perfused to their distal aspects without stenosis. The bilateral posterior communicating arteries are not visualized. Venous sinuses: As permitted by contrast timing, patent. Anatomic variants: None  significant. Review of the MIP images confirms the above findings IMPRESSION: 1. No intracranial large vessel occlusion. The PICAs are not definitively visualized. 2.  No hemodynamically significant stenosis in the neck. Code stroke imaging results were communicated on 09/24/2021 at 2:46 am to provider Dr. Lorrin Goodell via secure text paging. Electronically Signed   By: Merilyn Baba M.D.   On: 09/24/2021 02:46   ? ?PHYSICAL EXAM ? ?Physical Exam  ?Constitutional: Appears obese middle-age Caucasian male.  Not in distress. ?Cardiovascular: Normal rate and regular rhythm.  ?Respiratory: Effort normal, non-labored breathing ? ?Neuro: ?Mental Status: ?Patient is awake, alert, oriented to person, place, month, year, and situation. ?Patient is able to give a clear and coherent history. ?No signs of aphasia or neglect ?Cranial Nerves: ?II: Visual Fields are full ?III,IV, VI: EOMI with saccadic dysmetria on horizontal gaze bilaterally.  No nystagmus ?V: Facial sensation is symmetric to temperature ?VII: Facial movement is symmetric resting and smiling ?VIII: Hearing is intact to voice ?X: Palate elevates symmetrically ?XI: Shoulder shrug is symmetric. ?XII: Tongue protrudes midline without atrophy or fasciculations.  ?Motor: ?Tone is normal. Bulk is normal. 5/5 strength was present in all four extremities. Reduced L grip strength.  Orbits right over left upper extremity.  Diminished ankle movements on the left. ?Sensory: ?Sensation is symmetric to light touch in all four extremities.  ?Cerebellar: ?FNF intact bilaterally ?  ? ? ?ASSESSMENT/PLAN ?Allen Cooke is a 61 y.o. 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 NIHSS of 1, but patient was unable to ambulate. CTH negative. Was given TNK. MRI not yet performed.  ? ?Likely cerebellar/brainstem stroke due to small vessel disease from  uncontrolled risk factors, aborted with TNK. ? ?Code Stroke CT head: No acute abnormality.  ?CTA head & neck: No LVO, no significant stenosis ?MRI: not yet performed ?2D Echo: EF 60-65%, moderate LAE, no thrombus or

## 2021-09-24 NOTE — Plan of Care (Signed)
?  Problem: Education: ?Goal: Knowledge of disease or condition will improve ?Outcome: Progressing ?Goal: Knowledge of secondary prevention will improve (SELECT ALL) ?Outcome: Progressing ?Goal: Knowledge of patient specific risk factors will improve (INDIVIDUALIZE FOR PATIENT) ?Outcome: Progressing ?Goal: Individualized Educational Video(s) ?Outcome: Progressing ?  ?Problem: Coping: ?Goal: Will verbalize positive feelings about self ?Outcome: Progressing ?Goal: Will identify appropriate support needs ?Outcome: Progressing ?  ?Problem: Health Behavior/Discharge Planning: ?Goal: Ability to manage health-related needs will improve ?Outcome: Progressing ?  ?Problem: Self-Care: ?Goal: Ability to participate in self-care as condition permits will improve ?Outcome: Progressing ?Goal: Verbalization of feelings and concerns over difficulty with self-care will improve ?Outcome: Progressing ?Goal: Ability to communicate needs accurately will improve ?Outcome: Progressing ?  ?Problem: Nutrition: ?Goal: Risk of aspiration will decrease ?Outcome: Progressing ?Goal: Dietary intake will improve ?Outcome: Progressing ?  ?Problem: Intracerebral Hemorrhage Tissue Perfusion: ?Goal: Complications of Intracerebral Hemorrhage will be minimized ?Outcome: Progressing ?  ?Problem: Ischemic Stroke/TIA Tissue Perfusion: ?Goal: Complications of ischemic stroke/TIA will be minimized ?Outcome: Progressing ?  ?Problem: Spontaneous Subarachnoid Hemorrhage Tissue Perfusion: ?Goal: Complications of Spontaneous Subarachnoid Hemorrhage will be minimized ?Outcome: Progressing ?  ?Problem: Education: ?Goal: Knowledge of General Education information will improve ?Description: Including pain rating scale, medication(s)/side effects and non-pharmacologic comfort measures ?Outcome: Progressing ?  ?Problem: Health Behavior/Discharge Planning: ?Goal: Ability to manage health-related needs will improve ?Outcome: Progressing ?  ?Problem: Clinical  Measurements: ?Goal: Ability to maintain clinical measurements within normal limits will improve ?Outcome: Progressing ?Goal: Will remain free from infection ?Outcome: Progressing ?Goal: Diagnostic test results will improve ?Outcome: Progressing ?Goal: Respiratory complications will improve ?Outcome: Progressing ?Goal: Cardiovascular complication will be avoided ?Outcome: Progressing ?  ?Problem: Activity: ?Goal: Risk for activity intolerance will decrease ?Outcome: Progressing ?  ?Problem: Nutrition: ?Goal: Adequate nutrition will be maintained ?Outcome: Progressing ?  ?Problem: Coping: ?Goal: Level of anxiety will decrease ?Outcome: Progressing ?  ?Problem: Elimination: ?Goal: Will not experience complications related to bowel motility ?Outcome: Progressing ?Goal: Will not experience complications related to urinary retention ?Outcome: Progressing ?  ?Problem: Pain Managment: ?Goal: General experience of comfort will improve ?Outcome: Progressing ?  ?Problem: Safety: ?Goal: Ability to remain free from injury will improve ?Outcome: Progressing ?  ?Problem: Skin Integrity: ?Goal: Risk for impaired skin integrity will decrease ?Outcome: Progressing ?  ?

## 2021-09-24 NOTE — Progress Notes (Signed)
PT Cancellation Note ? ?Patient Details ?Name: Allen Cooke ?MRN: [id number] ?DOB: January 06, 1961 ? ? ?Cancelled Treatment:    Reason Eval/Treat Not Completed: Active bedrest order. Pt with tnKASE administration in early AM. Will await discontinuation of bedrest orders. ? ? ?Angelina Ok [facility] ?09/24/2021, 9:31 AM ?Skip Mayer PT ?Acute Rehabilitation Services ?Office [phone] ? ?

## 2021-09-24 NOTE — Evaluation (Signed)
Occupational Therapy Evaluation ?Patient Details ?Name: Allen Cooke ?MRN: [id number] ?DOB: [birth year] ?Today's Date: 09/24/2021 ? ? ?History of Present Illness 61 y.o. male with chief complaints of acute dizziness, vomiting, mild ataxia and  nystagmus. NIHSS 1; +TNK. CT (-) for CVA. Stroke workup underway. PMH: ETOH use, meningitis, prior right cerebellar strokes, anxiety.  ? ?Clinical Impression ?  ?PTA pt living "on the streets" since moving here from [place] 1 year ago. No complaints of dizziness laying in the bed, however increased complaints of dizziness,which did not subside after sitting EOB. Able to stand and take a couple of steps up to [facility], however returned to supine due to increased complaints of dizziness and nausea. No ataxia noted at this time. VSS. If dizziness improves, pt most likely to DC without need for OT follow up. Pt states he may be able to stay with his sister after DC. Discussed with PT and recommend vestibular trained PT assess pt to r/o BPPV or vestibular neuritis pending MRI. Will continue to follow to facilitate safe DC.  ?   ? ?Recommendations for follow up therapy are one component of a multi-disciplinary discharge planning process, led by the attending physician.  Recommendations may be updated based on patient status, additional functional criteria and insurance authorization.  ? ?Follow Up Recommendations ? Other (comment) (will further assess; most likely no OT follow up)  ?  ?Assistance Recommended at Discharge Intermittent Supervision/Assistance  ?Patient can return home with the following A little help with walking and/or transfers;A little help with bathing/dressing/bathroom;Assistance with cooking/housework;Direct supervision/assist for medications management;Direct supervision/assist for financial management;Help with stairs or ramp for entrance ? ?  ?Functional Status Assessment ? Patient has had a recent decline in their functional status and demonstrates the  ability to make significant improvements in function in a reasonable and predictable amount of time.  ?Equipment Recommendations ? Other (comment) (will assess)  ?  ?Recommendations for Other Services   ? ? ?  ?Precautions / Restrictions Precautions ?Precautions: Fall ?Precaution Comments: dizziness; nausea  ? ?  ? ?Mobility Bed Mobility ?Overal bed mobility: Needs Assistance ?Bed Mobility: Supine to Sit, Sit to Supine ?  ?  ?Supine to sit: Min assist ?Sit to supine: Min assist ?  ?  ?  ? ?Transfers ?Overall transfer level: Needs assistance ?Equipment used: Hemi-walker ?Transfers: Bed to chair/wheelchair/BSC, Sit to/from Stand ?Sit to Stand: Min assist ?  ?  ?  ?  ?  ?General transfer comment: limited by increased complaints of dizziness ?  ? ?  ?Balance Overall balance assessment: Needs assistance ?Sitting-balance support: Feet supported ?Sitting balance-Leahy Scale: Fair ?  ?  ?  ?Standing balance-Leahy Scale: Poor ?  ?  ?  ?  ?  ?  ?  ?  ?  ?  ?  ?  ?   ? ?ADL either performed or assessed with clinical judgement  ? ?ADL Overall ADL's : Needs assistance/impaired ?  ?  ?Grooming: Set up ?  ?Upper Body Bathing: Set up ?  ?Lower Body Bathing: Minimal assistance ?  ?Upper Body Dressing : Set up ?  ?Lower Body Dressing: Minimal assistance ?  ?Toilet Transfer: Minimal assistance (simulated) ?  ?Toileting- Clothing Manipulation and Hygiene: Minimal assistance ?  ?  ?  ?Functional mobility during ADLs: Minimal assistance ?General ADL Comments: Affected by dizziness  ? ? ? ?Vision Baseline Vision/History: 1 Wears glasses (reading) ?Patient Visual Report:  (will further assess - unable to maintain eyes open) ?Additional Comments: will further assess  ?   ?  Perception   ?  ?Praxis   ?  ? ?Pertinent Vitals/Pain Pain Assessment ?Pain Assessment: 0-10  ? ? ? ?Hand Dominance Right ?  ?Extremity/Trunk Assessment Upper Extremity Assessment ?Upper Extremity Assessment: Overall WFL for tasks assessed ?  ?Lower Extremity  Assessment ?Lower Extremity Assessment: Overall WFL for tasks assessed ?  ?Cervical / Trunk Assessment ?Cervical / Trunk Assessment: Normal ?  ?Communication   ?  ?Cognition   ?  ?  ?  ?  ?  ?  ?  ?  ?  ?  ?  ?  ?  ?  ?  ?  ?  ?  ?General Comments: discomfort with dizziness ?  ?  ?General Comments    ? ?  ?Exercises   ?  ?Shoulder Instructions    ? ? ?Home Living Family/patient expects to be discharged to:: Unsure ?  ?  ?Type of Home: Homeless ?  ?  ?  ?  ?  ?  ?  ?  ?  ?  ?  ?  ?  ?  ? Lives With:  ("on the streets") ? ?  ?Prior Functioning/Environment Prior Level of Function : Independent/Modified Independent ?  ?  ?  ?  ?  ?  ?  ?  ?  ? ?  ?  ?OT Problem List: Impaired balance (sitting and/or standing);Decreased activity tolerance;Decreased strength;Decreased coordination;Decreased cognition;Decreased knowledge of use of DME or AE;Obesity ?  ?   ?OT Treatment/Interventions: Self-care/ADL training;Therapeutic exercise;Neuromuscular education;Energy conservation;DME and/or AE instruction;Therapeutic activities;Cognitive remediation/compensation;Visual/perceptual remediation/compensation;Patient/family education;Balance training  ?  ?OT Goals(Current goals can be found in the care plan section) Acute Rehab OT Goals ?Patient Stated Goal: to stop dizziness and get some rest ?OT Goal Formulation: With patient ?Time For Goal Achievement: 10/07/21 ?Potential to Achieve Goals: Good  ?OT Frequency: Min 2X/week ?  ? ?Co-evaluation   ?  ?  ?  ?  ? ?  ?AM-PAC OT "6 Clicks" Daily Activity     ?Outcome Measure Help from another person eating meals?: None ?Help from another person taking care of personal grooming?: A Little ?Help from another person toileting, which includes using toliet, bedpan, or urinal?: A Little ?Help from another person bathing (including washing, rinsing, drying)?: A Little ?Help from another person to put on and taking off regular upper body clothing?: A Little ?Help from another person to put on and  taking off regular lower body clothing?: A Little ?6 Click Score: 19 ?  ?End of Session Nurse Communication: Mobility status;Patient requests pain meds;Precautions ? ?Activity Tolerance: Other (comment) (limited by dizzienss) ?Patient left: in bed;with call bell/phone within reach;with bed alarm set ? ?OT Visit Diagnosis: Unsteadiness on feet (R26.81);Other abnormalities of gait and mobility (R26.89);Other symptoms and signs involving the nervous system (R29.898);Dizziness and giddiness (R42)  ?              ?Time: [id number] ?OT Time Calculation (min): 25 min ?Charges:  OT General Charges ?$OT Visit: 1 Visit ?OT Evaluation ?$OT Eval Moderate Complexity: 1 Mod ?OT Treatments ?$Self Care/Home Management : 8-22 mins ? ?[facility], OT/L  ? ?Acute OT Clinical Specialist ?Acute Rehabilitation Services ?Pager [phone] ?Office [phone]  ? ?Trichelle Lehan,HILLARY ?09/24/2021, 3:20 PM ?

## 2021-09-24 NOTE — Progress Notes (Signed)
OT Cancellation Note ? ?Patient Details ?Name: Allen Cooke ?MRN: [id number] ?DOB: 1961-05-19 ? ? ?Cancelled Treatment:    Reason Eval/Treat Not Completed: Patient not medically ready;Active bedrest order ? ?Allen Cooke,HILLARY ?09/24/2021, 8:22 AM ?Luisa Dago, OT/L  ? ?Acute OT Clinical Specialist ?Acute Rehabilitation Services ?Pager [phone] ?Office [phone]  ?

## 2021-09-24 NOTE — Plan of Care (Signed)
Patient has stroke care booklet in room but  declined to talk about it now due to fatigue.  ?

## 2021-09-24 NOTE — Evaluation (Signed)
Speech Language Pathology Evaluation ?Patient Details ?Name: Allen Cooke ?MRN: [id number] ?DOB: 10-Aug-1960 ?Today's Date: 09/24/2021 ?Time: [id number] ?SLP Time Calculation (min) (ACUTE ONLY): 10 min ? ?Problem List:  ?Patient Active Problem List  ? Diagnosis Date Noted  ? Stroke determined by clinical assessment ([place]) 09/24/2021  ? Essential hypertension 08/04/2021  ? Localized edema 08/04/2021  ? Psychophysiological insomnia 08/04/2021  ? Chronic pain of right knee 08/04/2021  ? Urinary hesitancy 08/04/2021  ? Alcohol abuse 08/04/2021  ? Methamphetamine abuse ([place]) 08/04/2021  ? MDD (major depressive disorder), recurrent severe, without psychosis ([place]) 07/10/2021  ? Strain of right Achilles tendon 04/12/2017  ? Acute pain of right shoulder 04/12/2017  ? ?Past Medical History:  ?Past Medical History:  ?Diagnosis Date  ? Anxiety   ? ETOH abuse   ? Meningitis   ? as a teenager  ? ?Past Surgical History:  ?Past Surgical History:  ?Procedure Laterality Date  ? ANTERIOR CRUCIATE LIGAMENT REPAIR Right 1985  ? ?HPI:  ?Patient is a 61 y.o. 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 NIHSS was a 1 for mild ataxia. Initial order for speech-language cognitive evaluation but neurology NP spoke with SLP outside of patient's room and requested bedside swallow evaluation as well as speech-language evaluation.  ? ?Assessment / Plan / Recommendation ?Clinical Impression ? Patient participated in cognitive-linguistic and speech evaluation and at this time he appears WFL-WNL. He was oriented x4, demonstrated appropriate awareness, no deficits observed in memory or problem solving. Speech and language function were both WNL and no oral-motor weakness or assymetry observed. Patient denies any cognitive changes. SLP is not recommending f/u as patient appears to be at his baseline and independent  with cognitive-linguistic skills. ?   ?SLP Assessment ? SLP Recommendation/Assessment: Patient does not need any further [facility]  ?  ?Recommendations for follow up therapy are one component of a multi-disciplinary discharge planning process, led by the attending physician.  Recommendations may be updated based on patient status, additional functional criteria and insurance authorization. ?   ?Follow Up Recommendations ? No SLP follow up  ?  ?Assistance Recommended at Discharge ? None  ?Functional Status Assessment Patient has had a recent decline in their functional status and demonstrates the ability to make significant improvements in function in a reasonable and predictable amount of time.  ?Frequency and Duration    ?  ?  ?   ?SLP Evaluation ?Cognition ? Overall Cognitive Status: Within Functional Limits for tasks assessed ?Orientation Level: Oriented X4 ?Year: 2023 ?Month: April ?Day of Week: Correct ?Attention: Sustained;Selective ?Sustained Attention: Appears intact ?Selective Attention: Appears intact ?Memory: Appears intact ?Awareness: Appears intact ?Problem Solving: Appears intact ?Safety/Judgment: Appears intact  ?  ?   ?Comprehension ? Auditory Comprehension ?Overall Auditory Comprehension: Appears within functional limits for tasks assessed  ?  ?Expression Expression ?Primary Mode of Expression: Verbal ?Verbal Expression ?Overall Verbal Expression: Appears within functional limits for tasks assessed ?Initiation: No impairment   ?Oral / Motor ? Oral Motor/Sensory Function ?Overall Oral Motor/Sensory Function: Within functional limits ?Motor Speech ?Overall Motor Speech: Appears within functional limits for tasks assessed ?Respiration: Within functional limits ?Resonance: Within functional limits ?Articulation: Within functional limitis ?Intelligibility: Intelligible ?Motor Planning: Witnin functional limits   ?        ? ?Sonia Baller, MA, CCC-SLP ?Speech Therapy ? ? ?

## 2021-09-24 NOTE — Progress Notes (Signed)
PT Cancellation Note ? ?Patient Details ?Name: Allen Cooke ?MRN: [id number] ?DOB: 05/13/1961 ? ? ?Cancelled Treatment:    Reason Eval/Treat Not Completed: Other (comment). Per nursing and OT pt unable to tolerate PT eval at this time due to severe dizziness. ? ? ?Angelina Ok [facility] ?09/24/2021, 3:58 PM ?Skip Mayer PT ?Acute Rehabilitation Services ?Office [phone] ? ?

## 2021-09-24 NOTE — H&P (Addendum)
NEUROLOGY CONSULTATION NOTE   Date of service: September 24, 2021 Patient Name: Allen Cooke MRN:  [id number] DOB:  1960/11/14 Reason for consult: "Dizziness" Requesting Provider: Melene Plan, DO _ _ _   _ __   _ __ _ _  __ __   _ __   __ _  History of Present Illness  Allen Cooke is a 61 y.o. male with PMH significant for anxiety, EtOH use, meningitis, prior R cerebellar stroke noted on prior MRIs who presents with sudden onset Vertigo, nystagmus, nausea and vomiting.  He was on his phone when his symptoms started. Despite my best attempts, he is unable to provide an actual last known well. Symptoms started in the last couple hours but he is definitively sure that it is less than 3 hours.  No prior history of similar symptoms. No hx of ICH, denies prior history of stroke. No recent trauma, no recent surgeries in the last few months. No chest pain.  Despite his NIHSS being a 1 for mild ataxia, he has very significant nystagmus and vertigo to the point where he is unable to open his eyes or ambulate. He strongly feels that his symptoms are very disabling and would interfere with work, entertainment and hobbies.  LKW: ?midnight mRS: 0 tNKASE: discussed risks and benefits and details of administration and offered tNKASE to patient at 0216. Patient made a definitive decision to consent for tNKASE at 0218. Thrombectomy: not offered. NIHSS components Score: Comment  1a Level of Conscious 0[x]  1[]  2[]  3[]      1b LOC Questions 0[x]  1[]  2[]       1c LOC Commands 0[x]  1[]  2[]       2 Best Gaze 0[x]  1[]  2[]       3 Visual 0[x]  1[]  2[]  3[]      4 Facial Palsy 0[x]  1[]  2[]  3[]      5a Motor Arm - left 0[x]  1[]  2[]  3[]  4[]  UN[]    5b Motor Arm - Right 0[x]  1[]  2[]  3[]  4[]  UN[]    6a Motor Leg - Left 0[x]  1[]  2[]  3[]  4[]  UN[]    6b Motor Leg - Right 0[x]  1[]  2[]  3[]  4[]  UN[]    7 Limb Ataxia 0[]  1[x]  2[]  3[]  UN[]     8 Sensory 0[x]  1[]  2[]  UN[]      9 Best Language 0[x]  1[]  2[]  3[]      10  Dysarthria 0[x]  1[]  2[]  UN[]      11 Extinct. and Inattention 0[x]  1[]  2[]       TOTAL: 1     ROS   Constitutional Denies weight loss, fever and chills.   HEENT Denies changes in vision and hearing.   Respiratory Denies SOB and cough.   CV Denies palpitations and CP   GI Denies abdominal pain, nausea, vomiting and diarrhea.   GU Denies dysuria and urinary frequency.   MSK Denies myalgia and joint pain.   Skin Denies rash and pruritus.   Neurological Denies headache and syncope.   Psychiatric Denies recent changes in mood. Denies anxiety and depression.    Past History   Past Medical History:  Diagnosis Date   Anxiety    ETOH abuse    Meningitis    as a teenager   Past Surgical History:  Procedure Laterality Date   ANTERIOR CRUCIATE LIGAMENT REPAIR Right 1985   Family History  Problem Relation Age of Onset   Migraines Mother    Coronary aneurysm Mother    Social History   Socioeconomic History   Marital status:  Single    Spouse name: Not on file   Number of children: Not on file   Years of education: Not on file   Highest education level: Not on file  Occupational History   Not on file  Tobacco Use   Smoking status: Never   Smokeless tobacco: Never  Vaping Use   Vaping Use: Never used  Substance and Sexual Activity   Alcohol use: Not Currently    Comment: in rehab   Drug use: Never   Sexual activity: Not on file  Other Topics Concern   Not on file  Social History Narrative   Right handed   Social Determinants of Health   Financial Resource Strain: Not on file  Food Insecurity: Not on file  Transportation Needs: Not on file  Physical Activity: Not on file  Stress: Not on file  Social Connections: Not on file   No Known Allergies  Medications  (Not in a hospital admission)    Vitals   Vitals:   09/24/21 0200  Weight: 110.5 kg     Body mass index is 34.95 kg/m.  Physical Exam   General: Laying comfortably in bed; in no acute distress.   HENT: Normal oropharynx and mucosa. Normal external appearance of ears and nose.  Neck: Supple, no pain or tenderness  CV: No JVD. No peripheral edema.  Pulmonary: Symmetric Chest rise. Normal respiratory effort.  Abdomen: Soft to touch, non-tender.  Ext: No cyanosis, edema, or deformity  Skin: No rash. Normal palpation of skin.   Musculoskeletal: Normal digits and nails by inspection. No clubbing.   Neurologic Examination  Mental status/Cognition: Alert, oriented to self, place, month and year, good attention.  Speech/language: Fluent, comprehension intact, object naming intact, repetition intact.  Cranial nerves:   CN II Pupils equal and reactive to light, no VF deficits    CN III,IV,VI EOM intact, no gaze preference or deviation, + nystagmus    CN V normal sensation in V1, V2, and V3 segments bilaterally    CN VII no asymmetry, no nasolabial fold flattening    CN VIII normal hearing to speech    CN IX & X normal palatal elevation, no uvular deviation    CN XI 5/5 head turn and 5/5 shoulder shrug bilaterally   CN XII midline tongue protrusion    Motor:  Muscle bulk: normal, tone normal, pronator drift none tremor none Mvmt Root Nerve  Muscle Right Left Comments  SA C5/6 Ax Deltoid 5 5   EF C5/6 Mc Biceps 5 5   EE C6/7/8 Rad Triceps 5 5   WF C6/7 Med FCR     WE C7/8 PIN ECU     F Ab C8/T1 U ADM/FDI 5 5   HF L1/2/3 Fem Illopsoas 5 5   KE L2/3/4 Fem Quad 5 5   DF L4/5 D Peron Tib Ant 5 5   PF S1/2 Tibial Grc/Sol 5 5    Sensation:  Light touch Intact throughout   Pin prick    Temperature    Vibration   Proprioception    Coordination/Complex Motor:  - Finger to Nose mild pastpointing in LUE. - Heel to shin intact BL - Rapid alternating movement are intact BL - Gait: Deferred for patient safety.  Labs   CBC: No results for input(s): WBC, NEUTROABS, HGB, HCT, MCV, PLT in the last 168 hours.  Basic Metabolic Panel:  Lab Results  Component Value Date   NA 137  08/05/2021   K 3.7 08/05/2021  CO2 26 08/05/2021   GLUCOSE 122 (H) 08/05/2021   BUN 19 08/05/2021   CREATININE 1.19 08/05/2021   CALCIUM 8.9 08/05/2021   GFRNONAA >60 08/05/2021   Lipid Panel:  Lab Results  Component Value Date   LDLCALC 126 (H) 07/10/2021   HgbA1c:  Lab Results  Component Value Date   HGBA1C 5.4 07/10/2021   Urine Drug Screen: No results found for: LABOPIA, COCAINSCRNUR, LABBENZ, AMPHETMU, THCU, LABBARB  Alcohol Level     Component Value Date/Time   ETH 66 (H) 07/10/2021 0049    CT Head without contrast(Personally reviewed): CTH was negative for a large hypodensity concerning for a large territory infarct or hyperdensity concerning for an ICH  CT angio Head and Neck with contrast(Personally reviewed): No obvious LVO.  MRI Brain: pending   Impression   Drayson Latimer Mcdermid is a 61 y.o. male with PMH significant for anxiety, EtOH use, meningitis, prior R cerebellar stroke noted on prior MRIs who presents with sudden onset Vertigo, nystagmus, nausea and vomiting. tNKASE offered and patient consented to tNKASE. Thrombectomy not offered 2/2 no LVO. His neurologic examination is notable for nystagmus, mild LUE ataxia.  Primary Diagnosis:  Cerebral infarction, unspecified.  Secondary Diagnosis: Essential (primary) hypertension  Recommendations  Plan: - Frequent NeuroChecks for post tNKase NCCU protocol: - Initial CTH demonstrated no acute hemorrhage or mass - MRI Brain - pending - CTA - no LVO - TTE - pending - Lipid Panel: LDL - pending  - Statin: if LDL > 70 - HbA1c: pending - Antithrombotic: Start ASA 81 mg daily if 24 h CTH does not show acute hemorrhage - DVT prophylaxis: SCDs. Pharmacologic prophylaxis if 24 h CTH does not demonstrate acute hemorrhage - Systolic Blood Pressure goal: < 180 mm Hg - Telemetry monitoring for arrhythmia: 72 hours - Swallow screen - ordered - PT/OT/SLP consults  Hypertension: - hold home meds. SBP goal as  above. Will use PRN Labetalol and Cleviprex as needed.  _____________________________________________________________________  This patient is critically ill and at significant risk of neurological worsening, death and care requires constant monitoring of vital signs, hemodynamics,respiratory and cardiac monitoring, neurological assessment, discussion with family, other specialists and medical decision making of high complexity. I spent 40 minutes of neurocritical care time  in the care of  this patient. This was time spent independent of any time provided by nurse practitioner or PA.  Erick Blinks [facility] Pager Number [phone] 09/24/2021  2:42 AM  Signed,  Erick Blinks [facility] Pager Number [phone] _ _ _   _ __   _ __ _ _  __ __   _ __   __ _

## 2021-09-24 NOTE — Progress Notes (Signed)
PHARMACIST CODE STROKE RESPONSE ? ?Notified to mix TNK at 218 by Dr. Derry Lory ?Delivered TNK to RN at 2:22 ? ?TNK dose = 25 mg IV over 5 seconds ? ?Issues/delays encountered (if applicable): No weight prior to getting Ct scan.  ? ?Ruben Im, PharmD ?Clinical Pharmacist ?09/24/2021 2:27 AM ?Please check AMION for all [facility] Pharmacy numbers ? ?

## 2021-09-24 NOTE — TOC Progression Note (Signed)
Transition of Care (TOC) - Progression Note  ? ? ?Patient Details  ?Name: Allen Cooke ?MRN: [id number] ?Date of Birth: Aug 19, 1960 ? ?Transition of Care (TOC) CM/SW Contact  ?Beckie Busing, RN ?Phone Number:[phone] ? ?09/24/2021, 2:25 PM ? ?Clinical Narrative:    ? ?Transition of Care (TOC) Screening Note ? ? ?Patient Details  ?Name: Allen Cooke ?Date of Birth: 1961/02/03 ? ? ?Transition of Care (TOC) CM/SW Contact:    ?Beckie Busing, RN ?Phone Number: ?09/24/2021, 2:25 PM ? ? ? ?Transition of Care Department [facility]) has reviewed patient and no TOC needs have been identified at this time. We will continue to monitor patient advancement through interdisciplinary progression rounds. If new patient transition needs arise, please place a TOC consult. ? ? ? ? ?  ?  ? ?Expected Discharge Plan and Services ?  ?  ?  ?  ?  ?                ?  ?  ?  ?  ?  ?  ?  ?  ?  ?  ? ? ?Social Determinants of Health (SDOH) Interventions ?  ? ?Readmission Risk Interventions ?   ? View : No data to display.  ?  ?  ?  ? ? ?

## 2021-09-24 NOTE — Evaluation (Signed)
Clinical/Bedside Swallow Evaluation ?Patient Details  ?Name: Allen Cooke ?MRN: [id number] ?Date of Birth: 07-17-1960 ? ?Today's Date: 09/24/2021 ?Time: SLP Start Time (ACUTE ONLY): 1125 SLP Stop Time (ACUTE ONLY): 1135 ?SLP Time Calculation (min) (ACUTE ONLY): 10 min ? ?Past Medical History:  ?Past Medical History:  ?Diagnosis Date  ? Anxiety   ? ETOH abuse   ? Meningitis   ? as a teenager  ? ?Past Surgical History:  ?Past Surgical History:  ?Procedure Laterality Date  ? ANTERIOR CRUCIATE LIGAMENT REPAIR Right 1985  ? ?HPI:  ?Patient is a 61 y.o. 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 NIHSS was a 1 for mild ataxia. Initial order for speech-language cognitive evaluation but neurology NP spoke with SLP outside of patient's room and requested bedside swallow evaluation as well as speech-language evaluation.  ?  ?Assessment / Plan / Recommendation  ?Clinical Impression ? Patient is not currently presenting with any clinical s/s of dysphagia as per this bedside/clinical swallow evaluation. SLP observed patient with successive large straw sips of thin liquids (orange juice) and he exhibited timely swallow initiation, no overt s/s aspiration or penetration and no change in vocal quality or vital signs. SLP is recommending regular texture solids and thin liquids without need for SLP f/u. ?SLP Visit Diagnosis: Dysphagia, unspecified (R13.10) ?   ?Aspiration Risk ? No limitations  ?  ?Diet Recommendation Regular;Thin liquid  ? ?Liquid Administration via: Straw ?Medication Administration: Whole meds with liquid ?Supervision: Patient able to self feed ?Postural Changes: Seated upright at 90 degrees  ?  ?Other  Recommendations Oral Care Recommendations: Oral care BID   ? ?Recommendations for follow up therapy are one component of a multi-disciplinary discharge planning process, led by  the attending physician.  Recommendations may be updated based on patient status, additional functional criteria and insurance authorization. ? ?Follow up Recommendations No SLP follow up  ? ? ?  ?Assistance Recommended at Discharge None  ?Functional Status Assessment Patient has had a recent decline in their functional status and demonstrates the ability to make significant improvements in function in a reasonable and predictable amount of time.  ?Frequency and Duration   N/A ?  ?  ?   ? ?Prognosis   N/A ? ?  ? ?Swallow Study   ?General Date of Onset: 09/24/21 ?HPI: Patient is a 61 y.o. 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 NIHSS was a 1 for mild ataxia. Initial order for speech-language cognitive evaluation but neurology NP spoke with SLP outside of patient's room and requested bedside swallow evaluation as well as speech-language evaluation. ?Type of Study: Bedside Swallow Evaluation ?Previous Swallow Assessment: none found ?Diet Prior to this Study: NPO ?Temperature Spikes Noted: No ?Respiratory Status: Room air ?History of Recent Intubation: No ?Behavior/Cognition: Alert;Cooperative;Pleasant mood ?Oral Cavity Assessment: Within Functional Limits ?Oral Care Completed by SLP: No ?Oral Cavity - Dentition: Adequate natural dentition ?Self-Feeding Abilities: Able to feed self ?Patient Positioning: Upright in bed ?Baseline Vocal Quality: Normal ?Volitional Cough: Strong ?Volitional Swallow: Able to elicit  ?  ?Oral/Motor/Sensory Function Overall Oral Motor/Sensory Function: Within functional limits   ?Ice Chips     ?Thin Liquid Thin Liquid: Within functional limits ?Presentation: Straw;Self Fed  ?  ?Nectar Thick     ?Honey Thick     ?  Puree Puree: Not tested   ?Solid ? ? ?  Solid: Not tested  ? ?  ? ?Sonia Baller, MA, CCC-SLP ?Speech Therapy ? ? ? ?

## 2021-09-24 NOTE — ED Provider Notes (Signed)
?[facility] EMERGENCY DEPARTMENT ?Provider Note ? ? ?CSN: [id number] ?Arrival date & time: 09/24/21  0144 ? ?  ? ?History ? ?No chief complaint on file. ? ? ?Allen Cooke is a 61 y.o. male. ? ?61 yo M with a chief complaints of acute onset dizziness.  He tells me that he was sitting and looking at his phone and all of a sudden he felt very dizzy.  Worse with any movement of his head or his eyes.  Even with lying completely still he feels very dizzy.  He feels weak all over.  Denies one-sided numbness or weakness denies difficulty with speech or swallowing.  He feels like his vision is somewhat shaking but denies any change to its acuity.  He denies head injury denies neck pain. ? ? ? ?  ? ?Home Medications ?Prior to Admission medications   ?Medication Sig Start Date End Date Taking? Authorizing Provider  ?amLODipine (NORVASC) 5 MG tablet Take 1 tablet (5 mg total) by mouth daily. 08/03/21 08/03/22  Mayers, Cari S, PA-C  ?amoxicillin-clavulanate (AUGMENTIN) 875-125 MG tablet Take 1 tablet by mouth 2 (two) times daily. 08/06/21   Cecil Cobbs, PA-C  ?citalopram (CELEXA) 20 MG tablet Take 1 tablet (20 mg total) by mouth daily. 08/03/21 08/03/22  Mayers, Cari S, PA-C  ?hydrochlorothiazide (HYDRODIURIL) 50 MG tablet Take 1 tablet (50 mg total) by mouth daily. 08/03/21   Mayers, Cari S, PA-C  ?hydrOXYzine (VISTARIL) 25 MG capsule Take 1 capsule (25 mg total) by mouth 3 (three) times daily as needed for anxiety. 08/03/21   Mayers, Cari S, PA-C  ?loratadine (CLARITIN) 10 MG tablet Take 1 tablet (10 mg total) by mouth daily. 08/03/21   Mayers, Cari S, PA-C  ?meloxicam (MOBIC) 15 MG tablet Take 1 tablet (15 mg total) by mouth daily. 08/03/21 08/03/22  Mayers, Cari S, PA-C  ?sodium chloride (OCEAN) 0.65 % SOLN nasal spray Place 1 spray into both nostrils 4 (four) times daily as needed for congestion. 07/12/21   Ardis Hughs, NP  ?tamsulosin (FLOMAX) 0.4 MG CAPS capsule Take 1 capsule (0.4 mg total) by mouth  at bedtime. 08/03/21   Mayers, Cari S, PA-C  ?traZODone (DESYREL) 50 MG tablet Take 1-2 tabs PO qhs PRN 08/03/21   Mayers, Kasandra Knudsen, PA-C  ?   ? ?Allergies    ?Patient has no known allergies.   ? ?Review of Systems   ?Review of Systems ? ?Physical Exam ?Updated Vital Signs ?Wt 110.5 kg   BMI 34.95 kg/m?  ?Physical Exam ?Vitals and nursing note reviewed.  ?Constitutional:   ?   Appearance: He is well-developed.  ?HENT:  ?   Head: Normocephalic and atraumatic.  ?Eyes:  ?   Pupils: Pupils are equal, round, and reactive to light.  ?Neck:  ?   Vascular: No JVD.  ?Cardiovascular:  ?   Rate and Rhythm: Normal rate and regular rhythm.  ?   Heart sounds: No murmur heard. ?  No friction rub. No gallop.  ?Pulmonary:  ?   Effort: No respiratory distress.  ?   Breath sounds: No wheezing.  ?Abdominal:  ?   General: There is no distension.  ?   Tenderness: There is no abdominal tenderness. There is no guarding or rebound.  ?Musculoskeletal:     ?   General: Normal range of motion.  ?   Cervical back: Normal range of motion and neck supple.  ?Skin: ?   Coloration: Skin is not pale.  ?  Findings: No rash.  ?Neurological:  ?   Mental Status: He is alert and oriented to person, place, and time.  ?   Comments: Fast-growing nystagmus bilaterally.  This is not fatigable question very faint left-sided facial droop.  Otherwise benign neurologic exam for me.  ?Psychiatric:     ?   Behavior: Behavior normal.  ? ? ?ED Results / Procedures / Treatments   ?Labs ?(all labs ordered are listed, but only abnormal results are displayed) ?Labs Reviewed  ?RESP PANEL BY RT-PCR (FLU A&B, COVID) [id number]  ?ETHANOL  ?PROTIME-INR  ?APTT  ?CBC  ?DIFFERENTIAL  ?COMPREHENSIVE METABOLIC PANEL  ?RAPID URINE DRUG SCREEN, HOSP PERFORMED  ?URINALYSIS, ROUTINE W REFLEX MICROSCOPIC  ?I-STAT CHEM 8, ED  ? ? ?EKG ?None ? ?Radiology ?CT HEAD CODE STROKE WO CONTRAST ? ?Result Date: 09/24/2021 ?CLINICAL DATA:  Code stroke. EXAM: CT HEAD WITHOUT CONTRAST TECHNIQUE: Contiguous  axial images were obtained from the base of the skull through the vertex without intravenous contrast. RADIATION DOSE REDUCTION: This exam was performed according to the departmental dose-optimization program which includes automated exposure control, adjustment of the mA and/or kV according to patient size and/or use of iterative reconstruction technique. COMPARISON:  01/16/2021 FINDINGS: Brain: No evidence of acute infarction, hemorrhage, cerebral edema, mass, mass effect, or midline shift. Ventricles and sulci are normal for age. No extra-axial fluid collection. Vascular: No hyperdense vessel. Skull: Normal. Negative for fracture or focal lesion. Sinuses/Orbits: Mild mucosal thickening in the maxillary sinuses and ethmoid air cells. Other: The mastoid air cells are well aerated. ASPECTS [facility]([place] Stroke Program Early CT Score) - Ganglionic level infarction (caudate, lentiform nuclei, internal capsule, insula, M1-M3 cortex): 7 - Supraganglionic infarction (M4-M6 cortex): 3 Total score (0-10 with 10 being normal): 10 IMPRESSION: 1. No acute intracranial process. 2. ASPECTS is 10 Code stroke imaging results were communicated on 09/24/2021 at 2:12 am to provider Dr. Derry LoryKhaliqdina via secure text paging. Electronically Signed   By: Wiliam KeAlison  Vasan M.D.   On: 09/24/2021 02:12   ? ?Procedures ?Procedures  ? ? ?Medications Ordered in ED ?Medications  ?prochlorperazine (COMPAZINE) injection 10 mg (has no administration in time range)  ?diphenhydrAMINE (BENADRYL) injection 25 mg (has no administration in time range)  ?sodium chloride 0.9 % bolus 1,000 mL (has no administration in time range)  ?tenecteplase (TNKASE) injection for Stroke 25 mg (has no administration in time range)  ? ? ?ED Course/ Medical Decision Making/ A&P ?  ?                        ?Medical Decision Making ?Amount and/or Complexity of Data Reviewed ?Labs: ordered. ?Radiology: ordered. ? ?Risk ?Prescription drug management. ? ? ?61 yo M with a chief complaint  of acute onset dizziness.  This occurred while at rest and without head movement.  It was sudden and severe and has not improved.  Going on for about 2 hours now.  On my initial exam the patient has fast-growing nystagmus that is bidirectional.  It also is not fatigable.  This was concerning for a central cause of vertigo with him being in the window activated a code stroke.  Taken rapidly to CT.  CT scan without intracranial hemorrhage.  Given TNK. ? ? ?CRITICAL CARE ?Performed by: Rae Roamaniel Patrick Jailine Lieder ? ? ?Total critical care time: 35 minutes ? ?Critical care time was exclusive of separately billable procedures and treating other patients. ? ?Critical care was necessary to treat or prevent imminent or life-threatening  deterioration. ? ?Critical care was time spent personally by me on the following activities: development of treatment plan with patient and/or surrogate as well as nursing, discussions with consultants, evaluation of patient's response to treatment, examination of patient, obtaining history from patient or surrogate, ordering and performing treatments and interventions, ordering and review of laboratory studies, ordering and review of radiographic studies, pulse oximetry and re-evaluation of patient's condition. ? ?The patients results and plan were reviewed and discussed.   ?Any x-rays performed were independently reviewed by myself.  ? ?Differential diagnosis were considered with the presenting HPI. ? ?Medications  ?prochlorperazine (COMPAZINE) injection 10 mg (has no administration in time range)  ?diphenhydrAMINE (BENADRYL) injection 25 mg (has no administration in time range)  ?sodium chloride 0.9 % bolus 1,000 mL (has no administration in time range)  ?tenecteplase (TNKASE) injection for Stroke 25 mg (has no administration in time range)  ? ? ?Vitals:  ? 09/24/21 0200  ?Weight: 110.5 kg  ? ? ?Final diagnoses:  ?Vertigo, central  ? ? ?Admission/ observation were discussed with the admitting  physician, patient and/or family and they are comfortable with the plan.  ? ? ? ? ? ? ? ?Final Clinical Impression(s) / ED Diagnoses ?Final diagnoses:  ?Vertigo, central  ? ? ?Rx / DC Orders ?ED Discharge [place]

## 2021-09-24 NOTE — Code Documentation (Signed)
Stroke Response Nurse Documentation ?Code Documentation ? ?Oswald Jusino Savarino is a 61 y.o. male arriving to [facility]  via [facility] on 4/27 with past medical hx of CVA, anxiety, meningitis, ETOH use. On No antithrombotic. Code stroke was activated by ED.  ? ?Patient from home where he was LKW at between [place] now complaining of dizzyness .  ? ?Stroke team at the bedside on patient arrival. Labs drawn and patient cleared for CT by Dr. Tyrone Nine. Patient to CT with team. NIHSS 1, see documentation for details and code stroke times. Patient with left limb ataxia on exam. The following imaging was completed:  CTA. Patient is a candidate for IV Thrombolytic due to fixed neurological deficit. Patient is not not a candidate for IR due to No LVO.  ? ?Care Plan: TNK.  ? ?Bedside handoff with ED RN Kandee Keen.   ? ?Madelynn Done  ?Rapid Response RN ? ? ?

## 2021-09-25 LAB — GLUCOSE, CAPILLARY: Glucose-Capillary: 130 mg/dL — ABNORMAL HIGH (ref 70–99)

## 2021-09-25 MED ORDER — ASPIRIN EC 81 MG PO TBEC
81.0000 mg | DELAYED_RELEASE_TABLET | Freq: Every day | ORAL | Status: DC
  Administered 2021-09-25 – 2021-09-27 (×3): 81 mg via ORAL
  Filled 2021-09-25 (×3): qty 1

## 2021-09-25 MED ORDER — CLOPIDOGREL BISULFATE 75 MG PO TABS
75.0000 mg | ORAL_TABLET | Freq: Every day | ORAL | Status: DC
  Administered 2021-09-25 – 2021-09-27 (×3): 75 mg via ORAL
  Filled 2021-09-25 (×3): qty 1

## 2021-09-25 MED ORDER — MECLIZINE HCL 25 MG PO TABS
25.0000 mg | ORAL_TABLET | Freq: Three times a day (TID) | ORAL | Status: DC | PRN
  Administered 2021-09-27: 25 mg via ORAL
  Filled 2021-09-25 (×3): qty 1

## 2021-09-25 MED ORDER — ONDANSETRON HCL 4 MG/2ML IJ SOLN: 4.0000 mg | Freq: Three times a day (TID) | INTRAMUSCULAR | Status: DC | PRN

## 2021-09-25 NOTE — Progress Notes (Addendum)
STROKE TEAM PROGRESS NOTE  ? ?INTERVAL HISTORY ?The patient is seen in his room this morning with no family at bedside. He reports significant improvement in his dizziness. He was able to ambulate with RW with PT but had to stop d/t dizziness.  MRI brain is negative for acute stroke ?Vitals:  ? 09/25/21 1000 09/25/21 1100 09/25/21 1130 09/25/21 1200  ?BP: (!) 149/86 (!) 141/81  (!) 120/54  ?Pulse: (!) 59 (!) 48  (!) 47  ?Resp: 15 13  11   ?Temp:   98.1 ?F (36.7 ?C)   ?TempSrc:   Oral   ?SpO2: 93% 90%  90%  ?Weight:      ? ?CBC:  ?Recent Labs  ?Lab 09/24/21 ?0248 09/24/21 ?0253  ?WBC 8.2  --   ?NEUTROABS 6.1  --   ?HGB 14.7 15.0  ?HCT 43.3 44.0  ?MCV 88.0  --   ?PLT 205  --   ? ?Basic Metabolic Panel:  ?Recent Labs  ?Lab 09/24/21 ?0248 09/24/21 ?0253  ?NA 136 139  ?K 3.6 3.7  ?CL 102 100  ?CO2 25  --   ?GLUCOSE 129* 130*  ?BUN 15 17  ?CREATININE 0.98 0.90  ?CALCIUM 9.1  --   ? ?Lipid Panel:  ?Recent Labs  ?Lab 09/24/21 ?0248  ?CHOL 189  ?TRIG 124  ?HDL 28*  ?CHOLHDL 6.8  ?VLDL 25  ?LDLCALC 136*  ? ?HgbA1c:  ?Recent Labs  ?Lab 09/24/21 ?0248  ?HGBA1C 5.9*  ? ?Urine Drug Screen:  ?Recent Labs  ?Lab 09/24/21 ?1335  ?LABOPIA NONE DETECTED  ?COCAINSCRNUR NONE DETECTED  ?LABBENZ NONE DETECTED  ?AMPHETMU NONE DETECTED  ?THCU NONE DETECTED  ?LABBARB NONE DETECTED  ?  ?Alcohol Level  ?Recent Labs  ?Lab 09/24/21 ?0248  ?ETH <10  ? ? ?IMAGING past 24 hours ?MR BRAIN WO CONTRAST ? ?Result Date: 09/24/2021 ?CLINICAL DATA:  Acute neuro deficit.  Posterior NK. EXAM: MRI HEAD WITHOUT CONTRAST TECHNIQUE: Multiplanar, multiecho pulse sequences of the brain and surrounding structures were obtained without intravenous contrast. COMPARISON:  CT head 09/24/2021 FINDINGS: Brain: No acute infarction, hemorrhage, hydrocephalus, extra-axial collection or mass lesion. Very mild white matter changes. Mild patchy hyperintensity in the pons. Vascular: Normal arterial flow voids. Skull and upper cervical spine: Negative Sinuses/Orbits: Mucosal edema  paranasal sinuses.  Negative orbit Other: None IMPRESSION: Negative for acute infarct. Mild chronic microvascular ischemic change. Sinus mucosal edema. Electronically Signed   By: Franchot Gallo M.D.   On: 09/24/2021 17:51   ? ?PHYSICAL EXAM ? ?Physical Exam  ?Constitutional: Appears obese middle-age Caucasian male.  Not in distress. ?Cardiovascular: Normal rate and regular rhythm.  ?Respiratory: Effort normal, non-labored breathing ? ?Neuro: ?Mental Status: ?Patient is awake, alert, oriented to person, place, month, year, and situation. ?Patient is able to give a clear and coherent history. ?No signs of aphasia or neglect ?Cranial Nerves: ?II: Visual Fields are full ?III,IV, VI: EOMI with saccadic dysmetria on horizontal gaze bilaterally.  No nystagmus ?V: Facial sensation is symmetric to temperature ?VII: Facial movement is symmetric resting and smiling ?VIII: Hearing is intact to voice ?X: Palate elevates symmetrically ?XI: Shoulder shrug is symmetric. ?XII: Tongue protrudes midline without atrophy or fasciculations.  ?Motor: ?Tone is normal. Bulk is normal. 5/5 strength was present in all four extremities. Improved grip strength with no orbiting ?Sensory: ?Sensation is symmetric to light touch in all four extremities.  ?Cerebellar: ?FNF intact bilaterally ?  ? ? ?ASSESSMENT/PLAN ?Allen Cooke is a 61 y.o. male with PMH significant for anxiety,  EtOH use, meningitis, prior R cerebellar stroke noted on prior MRIs who presents with sudden onset vertigo, nystagmus, nausea and vomiting. NIHSS of 1, but patient was unable to ambulate. CTH negative. Was given TNK. MRI not yet performed.  ? ?Likely cerebellar/brainstem stroke due to small vessel disease from uncontrolled risk factors, aborted with TNK and not seen on MRI. Currently awaiting improvement with PT before discharge. Transfer order placed.  Meclizine and Zofran added.  ? ?Code Stroke CT head: No acute abnormality.  ?CTA head & neck: No LVO, no  significant stenosis ?MRI: no infarct ?2D Echo: EF 60-65%, moderate LAE, no thrombus or interatrial shunt ?LDL 136 ?HgbA1c 5.9 ?VTE prophylaxis - SCDs ?   ?Diet  ? Diet Heart Room service appropriate? Yes; Fluid consistency: Thin  ? ?No antithrombotic prior to admission, now on ASA and Plavix (>24 hrs post TNK). ?Therapy recommendations:  SNF ?Disposition:  TBD ? ?Hypertension ?Home meds:  amlodipine, hydrochlorothiazide ?Stable ?Permissive hypertension, SBP <180 ?Long-term BP goal normotensive ? ?Hyperlipidemia ?Home meds:  NA ?LDL 136, goal < 70 ?Add Crestor 20mg   ?Continue statin at discharge ? ?Glucose control ?Home meds: NA ?HgbA1c 5.9, goal < 7.0 ?CBGs ?Recent Labs  ?  09/24/21 ?1935 09/24/21 ?2306 09/25/21 ?0308  ?GLUCAP 138* 128* 130*  ?  ?SSI ? ?Other Stroke Risk Factors ?ETOH use, alcohol level <10, advised to drink no more than 1 drink a day ?Obesity, Body mass index is 34.95 kg/m?., BMI >/= 30 associated with increased stroke risk, recommend weight loss, diet and exercise as appropriate  ?Suspected obstructive sleep apnea, not on CPAP at home ? ? ?Hospital day # 1 ? ? ?Corky Sox, MD ?PGY-1 ? ?I have personally obtained history,examined this patient, reviewed notes, independently viewed imaging studies, participated in medical decision making and plan of care.ROS completed by me personally and pertinent positives fully documented  I have made any additions or clarifications directly to the above note. Agree with note above.  Patient is still having some dizziness and nausea we will treat with some Reglan and meclizine.  Mobilize out of bed.  Continue ongoing therapies.  Transfer to neurology floor bed.  Likely discharge home tomorrow if stable.  Greater than 50% time during this 50-minute visit was spent in counseling and coordination of care and discussion with patient and care team and answering questions. ? ?Antony Contras, MD ?Medical Director ?[facility] ?Pager:  [phone] ?09/25/2021 1:35 PM ? ? ? ?To contact Stroke Continuity provider, please refer to [email or web address] ?After hours, contact General Neurology ? ?

## 2021-09-25 NOTE — Evaluation (Signed)
Physical Therapy Evaluation ?Patient Details ?Name: Allen Cooke ?MRN: [id number] ?DOB: [birth year] ?Today's Date: 09/25/2021 ? ?History of Present Illness ? 61 y.o. male with chief complaints of acute dizziness, vomiting, mild ataxia and  nystagmus. NIHSS 1; +TNK. PMH: ETOH use, meningitis, prior right cerebellar strokes, anxiety.  ?Clinical Impression ? Pt admitted with above diagnosis. Pt was able to ambulate with RW but with mod assist due to imbalance and need to use compensatory strategies due to continued dizziness with movement.  Completed Epley maneuver for suspected right posterior canal BPPV. Also initiated x 1 exercises as suspect pt with right hypofunction as well. Messaged MD regarding nausea medicine and Meclizine or Antivert trial to ease symptoms.  Will follow acutely.  Pt currently with functional limitations due to the deficits listed below (see PT Problem List). Pt will benefit from skilled PT to increase their independence and safety with mobility to allow discharge to the venue listed below.      ?   ? ?Recommendations for follow up therapy are one component of a multi-disciplinary discharge planning process, led by the attending physician.  Recommendations may be updated based on patient status, additional functional criteria and insurance authorization. ? ?Follow Up Recommendations Skilled nursing-short term rehab (<3 hours/day) (unless sister can assist pt more than 2 days) ? ?  ?Assistance Recommended at Discharge Frequent or constant Supervision/Assistance  ?Patient can return home with the following ? A lot of help with walking and/or transfers;A lot of help with bathing/dressing/bathroom;Assist for transportation ? ?  ?Equipment Recommendations Rolling walker (2 wheels)  ?Recommendations for Other Services ?    ?  ?Functional Status Assessment    ? ?  ?Precautions / Restrictions Precautions ?Precautions: Fall ?Precaution Comments: dizziness; nausea ?Restrictions ?Weight Bearing  Restrictions: No  ? ?  ? ?Mobility ? Bed Mobility ?Overal bed mobility: Needs Assistance ?Bed Mobility: Supine to Sit, Sit to Supine ?  ?  ?Supine to sit: Min assist ?Sit to supine: Min assist ?  ?General bed mobility comments: Pt appeared positive for right BPPV posterior canal but difficult as pt has difficulty keeping eyes open.  Treated with epley maneuver and pt needed min assist to sit up after the maneuver due to dizziness. ?  ? ?Transfers ?Overall transfer level: Needs assistance ?Equipment used: Rolling walker (2 wheels) ?Transfers: Sit to/from Stand ?Sit to Stand: Min assist, From elevated surface ?  ?  ?  ?  ?  ?General transfer comment: Pt encouraged to use compensatory strategies while up.  Overall pt able to do so.  min assist to steady due to dizziness with movement ?  ? ?Ambulation/Gait ?Ambulation/Gait assistance: Min assist, Mod assist ?Gait Distance (Feet): 30 Feet ?Assistive device: Rolling walker (2 wheels) ?Gait Pattern/deviations: Step-through pattern, Decreased stride length, Ataxic, Drifts right/left, Staggering left, Staggering right ?  ?Gait velocity interpretation: <1.31 ft/sec, indicative of household ambulator ?  ?General Gait Details: Pt was able to ambulate with RW with cues for compensation throughout as pt dizzy.  Pt with unsteady gait needing min to mod assist as he had at least 2 significant LOB due to dizziness. Pt needs cues to stay close to RW. ? ?Stairs ?  ?  ?  ?  ?  ? ?Wheelchair Mobility ?  ? ?Modified Rankin (Stroke Patients Only) ?  ? ?  ? ?Balance Overall balance assessment: Needs assistance ?Sitting-balance support: No upper extremity supported, Feet supported ?Sitting balance-Leahy Scale: Fair ?  ?  ?Standing balance support: Bilateral upper extremity supported,  During functional activity ?Standing balance-Leahy Scale: Poor ?Standing balance comment: relies on UE support and external support with RW statically and dynamically. ?  ?  ?  ?  ?  ?  ?  ?  ?  ?  ?  ?    ? ? ? ?Pertinent Vitals/Pain Pain Assessment ?Pain Assessment: 0-10 ?Pain Score: 8  ?Pain Location: right knee ?Pain Descriptors / Indicators: Grimacing, Guarding ?Pain Intervention(s): Limited activity within patient's tolerance, Monitored during session, Repositioned  ? ? ?Home Living Family/patient expects to be discharged to:: Shelter/Homeless ?Living Arrangements: Other relatives (staying with sister) ?  ?  ?  ?  ?  ?  ?  ?  ?Additional Comments: pt states can only stay with sister a day or so and then will be on streets  ?  ?Prior Function Prior Level of Function : Independent/Modified Independent;Driving (unemployed currently) ?  ?  ?  ?  ?  ?  ?  ?  ?  ? ? ?Hand Dominance  ? Dominant Hand: Right ? ?  ?Extremity/Trunk Assessment  ? Upper Extremity Assessment ?Upper Extremity Assessment: Defer to OT evaluation ?  ? ?Lower Extremity Assessment ?Lower Extremity Assessment: Overall WFL for tasks assessed ?  ? ?Cervical / Trunk Assessment ?Cervical / Trunk Assessment: Normal  ?Communication  ? Communication: No difficulties  ?Cognition Arousal/Alertness: Awake/alert ?Behavior During Therapy: [facility] for tasks assessed/performed ?Overall Cognitive Status: Within Functional Limits for tasks assessed ?  ?  ?  ?  ?  ?  ?  ?  ?  ?  ?  ?  ?  ?  ?  ?  ?General Comments: discomfort with dizziness ?  ?  ? ?  ?General Comments General comments (skin integrity, edema, etc.): VSS ? ?  ?Exercises Other Exercises ?Other Exercises: Allen Cooke handout for use as needed ?Other Exercises: Gave x1 exercise progression handout for pt.  Pt did perform 1 rep of x 1 exercise in sitting and encouraged pt to perform every hour x reps of head nods.  ? ?Assessment/Plan  ?  ?PT Assessment    ?PT Problem List   ? ?   ?  ?PT Treatment Interventions     ? ?PT Goals (Current goals can be found in the Care Plan section)  ?  ? ?  ?Frequency Min 3X/week ?  ? ? ?Co-evaluation   ?  ?  ?  ?  ? ? ?  ?AM-PAC PT "6 Clicks" Mobility  ?Outcome Measure  Help needed turning from your back to your side while in a flat bed without using bedrails?: A Little ?Help needed moving from lying on your back to sitting on the side of a flat bed without using bedrails?: A Little ?Help needed moving to and from a bed to a chair (including a wheelchair)?: A Little ?Help needed standing up from a chair using your arms (e.g., wheelchair or bedside chair)?: A Lot ?Help needed to walk in hospital room?: A Lot ?Help needed climbing 3-5 steps with a railing? : A Lot ?6 Click Score: 15 ? ?  ?End of Session Equipment Utilized During Treatment: Gait belt ?Activity Tolerance: Patient limited by fatigue ?Patient left: in chair;with call bell/phone within reach;with chair alarm set ?Nurse Communication: Mobility status ?PT Visit Diagnosis: Muscle weakness (generalized) (M62.81);Dizziness and giddiness (R42);BPPV ?BPPV - Right/Left : Right ?  ? ?Time: [id number] ?PT Time Calculation (min) (ACUTE ONLY): 49 min ? ? ?Charges:   PT Evaluation ?$PT Eval Moderate Complexity: 1  Mod ?PT Treatments ?$Gait Training: 8-22 mins ?$Canalith Rep Proc: 8-22 mins ?  ?   ? ? ?Anevay Campanella M,PT ?Acute Rehab Services ?[phone] ?[phone] (pager)  ? ?Alvira Philips ?09/25/2021, 2:35 PM ? ?

## 2021-09-25 NOTE — Progress Notes (Signed)
?   09/25/21 0900  ?PT Visit Information  ?Last PT Received On 09/25/21  ? PT - Assessment/Plan  ?Follow Up Recommendations Skilled nursing-short term rehab (<3 hours/day) ?(Vestibular therapy - unless sister can assist pt more than 2 days)  ?Assistance recommended at discharge Frequent or constant Supervision/Assistance  ?Patient can return home with the following A little help with walking and/or transfers;A little help with bathing/dressing/bathroom;Assist for transportation;Help with stairs or ramp for entrance  ?PT equipment Rolling walker (2 wheels)  ? ?Evaluation completed and full note to follow.  Pt with positive right hypofunction of vestibular system.  Epley maneuver completed for right posterior canal BPPV as well as  and x1 exercises inititiated.  Gave pt Austin Miles exercise handout as well as x1 exercise handout. Pt to work on x1 exercises in sitting later today.  Pt was unsteady with gait with RW and did not use device PTA.  Pt unsafe to d/c home at current level as he is a high fall risk.  Will follow acutely.   ?Dillon Livermore M,PT ?Acute Rehab Services ?[phone] ?[phone] (pager)  ?

## 2021-09-25 NOTE — TOC Initial Note (Signed)
Transition of Care (TOC) - Initial/Assessment Note  ? ? ?Patient Details  ?Name: Allen Cooke ?MRN: [id number] ?Date of Birth: [birth year] ? ?Transition of Care [facility]) CM/SW Contact:    ?Joanne Chars, LCSW ?Phone Number: ?09/25/2021, 3:59 PM ? ?Clinical Narrative:   CSW met with pt regarding DC recommendation for SNF.  Pt is homeless and uninsured.  No income.  Pt reports that he spent the night at his sister's home prior to his admission here but that he has been mostly homeless and on the street for the past year.    Discussed that SNF is unlikely.  Pt is unsure how much support his sister can offer at DC.  Permission given to speak with sister Katrina, but pt would like to speak with her first before she is called by TOC.  TOC will continue to follow for DC needs.             ? ? ?Expected Discharge Plan: [place] ?Barriers to Discharge: Continued Medical Work up, Homeless with medical needs ? ? ?Patient Goals and CMS Choice ?Patient states their goals for this hospitalization and ongoing recovery are:: "get healthy" ?  ?  ? ?Expected Discharge Plan and Services ?Expected Discharge Plan: [place] ?In-house Referral: Clinical Social Work ?  ?Post Acute Care Choice:  (TBD) ?Living arrangements for the past 2 months: Homeless ?                ?  ?  ?  ?  ?  ?  ?  ?  ?  ?  ? ?Prior Living Arrangements/Services ?Living arrangements for the past 2 months: Homeless ?Lives with:: Other (Comment) (homeless) ?Patient language and need for interpreter reviewed:: Yes ?Do you feel safe going back to the place where you live?: No   pt homeless  ?Need for Family Participation in Patient Care: No (Comment) ?Care giver support system in place?: Yes (comment) ?Current home services: Other (comment) (none) ?Criminal Activity/Legal Involvement Pertinent to Current Situation/Hospitalization: No - Comment as needed ? ?Activities of Daily Living ?  ?  ? ?Permission  Sought/Granted ?Permission sought to share information with : Family Supports ?Permission granted to share information with : Yes, Verbal Permission Granted ? Share Information with NAME: sister Katrina ?   ?   ?   ? ?Emotional Assessment ?Appearance:: Appears stated age ?Attitude/Demeanor/Rapport: Engaged ?Affect (typically observed): Appropriate, Pleasant ?Orientation: : Oriented to Self, Oriented to Place, Oriented to  Time, Oriented to Situation ?Alcohol / Substance Use: Alcohol Use ?Psych Involvement: No (comment) ? ?Admission diagnosis:  Vertigo, central [H81.4] ?Stroke determined by clinical assessment [facility]) [I63.9] ?Patient Active Problem List  ? Diagnosis Date Noted  ? Stroke determined by clinical assessment ([place]) 09/24/2021  ? Essential hypertension 08/04/2021  ? Localized edema 08/04/2021  ? Psychophysiological insomnia 08/04/2021  ? Chronic pain of right knee 08/04/2021  ? Urinary hesitancy 08/04/2021  ? Alcohol abuse 08/04/2021  ? Methamphetamine abuse ([place]) 08/04/2021  ? MDD (major depressive disorder), recurrent severe, without psychosis ([place]) 07/10/2021  ? Strain of right Achilles tendon 04/12/2017  ? Acute pain of right shoulder 04/12/2017  ? ?PCP:  Marliss Coots, NP ?Pharmacy:   ?[facility] [address] [place] [facility] ?[place] [address] ?Phone: [phone] Fax: [phone] ? ? ? ? ?Social Determinants of Health (SDOH) Interventions ?  ? ?Readmission Risk Interventions ?   ? View : No  data to display.  ?  ?  ?  ? ? ? ?

## 2021-09-26 DIAGNOSIS — H814 Vertigo of central origin: Secondary | ICD-10-CM

## 2021-09-26 MED ORDER — CLOPIDOGREL BISULFATE 75 MG PO TABS: 75.0000 mg | ORAL_TABLET | Freq: Every day | ORAL | 0 refills | Status: AC

## 2021-09-26 MED ORDER — ASPIRIN 81 MG PO TBEC: 81.0000 mg | DELAYED_RELEASE_TABLET | Freq: Every day | ORAL | 11 refills | Status: DC

## 2021-09-26 MED ORDER — MECLIZINE HCL 25 MG PO TABS: 25.0000 mg | ORAL_TABLET | Freq: Three times a day (TID) | ORAL | 0 refills | Status: DC | PRN

## 2021-09-26 MED ORDER — ASPIRIN 81 MG PO TBEC: 81.0000 mg | DELAYED_RELEASE_TABLET | Freq: Every day | ORAL | 11 refills | Status: AC

## 2021-09-26 MED ORDER — ROSUVASTATIN CALCIUM 20 MG PO TABS: 20.0000 mg | ORAL_TABLET | Freq: Every day | ORAL | 1 refills | Status: AC

## 2021-09-26 MED ORDER — MECLIZINE HCL 25 MG PO TABS: 25.0000 mg | ORAL_TABLET | Freq: Three times a day (TID) | ORAL | 0 refills | Status: AC | PRN

## 2021-09-26 MED ORDER — AMLODIPINE BESYLATE 5 MG PO TABS
5.0000 mg | ORAL_TABLET | Freq: Every day | ORAL | Status: DC
  Administered 2021-09-26 – 2021-09-27 (×2): 5 mg via ORAL
  Filled 2021-09-26 (×2): qty 1

## 2021-09-26 MED ORDER — ROSUVASTATIN CALCIUM 20 MG PO TABS: 20.0000 mg | ORAL_TABLET | Freq: Every day | ORAL | 1 refills | Status: DC

## 2021-09-26 MED ORDER — CLOPIDOGREL BISULFATE 75 MG PO TABS: 75.0000 mg | ORAL_TABLET | Freq: Every day | ORAL | 0 refills | Status: DC

## 2021-09-26 NOTE — Plan of Care (Signed)
?  Problem: Education: ?Goal: Knowledge of disease or condition will improve ?Outcome: Adequate for Discharge ?Goal: Knowledge of secondary prevention will improve (SELECT ALL) ?Outcome: Adequate for Discharge ?Goal: Knowledge of patient specific risk factors will improve (INDIVIDUALIZE FOR PATIENT) ?Outcome: Adequate for Discharge ?Goal: Individualized Educational Video(s) ?Outcome: Adequate for Discharge ?  ?Problem: Coping: ?Goal: Will verbalize positive feelings about self ?Outcome: Adequate for Discharge ?Goal: Will identify appropriate support needs ?Outcome: Adequate for Discharge ?  ?Problem: Health Behavior/Discharge Planning: ?Goal: Ability to manage health-related needs will improve ?Outcome: Adequate for Discharge ?  ?Problem: Self-Care: ?Goal: Ability to participate in self-care as condition permits will improve ?Outcome: Adequate for Discharge ?Goal: Verbalization of feelings and concerns over difficulty with self-care will improve ?Outcome: Adequate for Discharge ?Goal: Ability to communicate needs accurately will improve ?Outcome: Adequate for Discharge ?  ?Problem: Nutrition: ?Goal: Risk of aspiration will decrease ?Outcome: Adequate for Discharge ?Goal: Dietary intake will improve ?Outcome: Adequate for Discharge ?  ?Problem: Intracerebral Hemorrhage Tissue Perfusion: ?Goal: Complications of Intracerebral Hemorrhage will be minimized ?Outcome: Adequate for Discharge ?  ?Problem: Ischemic Stroke/TIA Tissue Perfusion: ?Goal: Complications of ischemic stroke/TIA will be minimized ?Outcome: Adequate for Discharge ?  ?Problem: Spontaneous Subarachnoid Hemorrhage Tissue Perfusion: ?Goal: Complications of Spontaneous Subarachnoid Hemorrhage will be minimized ?Outcome: Adequate for Discharge ?  ?Problem: Education: ?Goal: Knowledge of General Education information will improve ?Description: Including pain rating scale, medication(s)/side effects and non-pharmacologic comfort measures ?Outcome: Adequate for  Discharge ?  ?Problem: Health Behavior/Discharge Planning: ?Goal: Ability to manage health-related needs will improve ?Outcome: Adequate for Discharge ?  ?Problem: Clinical Measurements: ?Goal: Ability to maintain clinical measurements within normal limits will improve ?Outcome: Adequate for Discharge ?Goal: Will remain free from infection ?Outcome: Adequate for Discharge ?Goal: Diagnostic test results will improve ?Outcome: Adequate for Discharge ?Goal: Respiratory complications will improve ?Outcome: Adequate for Discharge ?Goal: Cardiovascular complication will be avoided ?Outcome: Adequate for Discharge ?  ?Problem: Activity: ?Goal: Risk for activity intolerance will decrease ?Outcome: Adequate for Discharge ?  ?Problem: Nutrition: ?Goal: Adequate nutrition will be maintained ?Outcome: Adequate for Discharge ?  ?Problem: Coping: ?Goal: Level of anxiety will decrease ?Outcome: Adequate for Discharge ?  ?Problem: Elimination: ?Goal: Will not experience complications related to bowel motility ?Outcome: Adequate for Discharge ?Goal: Will not experience complications related to urinary retention ?Outcome: Adequate for Discharge ?  ?Problem: Pain Managment: ?Goal: General experience of comfort will improve ?Outcome: Adequate for Discharge ?  ?Problem: Safety: ?Goal: Ability to remain free from injury will improve ?Outcome: Adequate for Discharge ?  ?Problem: Skin Integrity: ?Goal: Risk for impaired skin integrity will decrease ?Outcome: Adequate for Discharge ?  ?

## 2021-09-26 NOTE — Discharge Summary (Deleted)
Stroke Discharge Summary  ?Patient ID: Allen Cooke    l ?  MRN: [id number]    ?  DOB: 08/07/1960 ? ?Date of Admission: 09/24/2021 ?Date of Discharge: 09/26/2021 ? ?Attending Physician:  Stroke, Md, MD, Stroke MD ?Consultant(s):    None  ?Patient's PCP:  Allen Sharps, NP ? ?DISCHARGE DIAGNOSIS: Likely cerebellar/brainstem stroke due to small vessel disease from uncontrolled risk factors, aborted with TNK and not seen on MRI ?Principal Problem: ?  Stroke determined by clinical assessment (HCC) ? ? ?Allergies as of 09/26/2021   ?No Known Allergies ?  ? ?  ?Medication List  ?  ? ?STOP taking these medications   ? ?amoxicillin-clavulanate 875-125 MG tablet ?Commonly known as: AUGMENTIN ?  ? ?  ? ?TAKE these medications   ? ?amLODipine 5 MG tablet ?Commonly known as: NORVASC ?Take 1 tablet (5 mg total) by mouth daily. ?  ?aspirin 81 MG EC tablet ?Take 1 tablet (81 mg total) by mouth daily. Swallow whole. ?Start taking on: September 27, 2021 ?  ?citalopram 20 MG tablet ?Commonly known as: CeleXA ?Take 1 tablet (20 mg total) by mouth daily. ?  ?clopidogrel 75 MG tablet ?Commonly known as: PLAVIX ?Take 1 tablet (75 mg total) by mouth daily. ?Start taking on: September 27, 2021 ?  ?hydrochlorothiazide 50 MG tablet ?Commonly known as: HYDRODIURIL ?Take 1 tablet (50 mg total) by mouth daily. ?  ?hydrOXYzine 25 MG capsule ?Commonly known as: Vistaril ?Take 1 capsule (25 mg total) by mouth 3 (three) times daily as needed for anxiety. ?  ?Lasix 20 MG tablet ?Generic drug: furosemide ?Take 20 mg by mouth daily as needed for fluid or edema. ?  ?loratadine 10 MG tablet ?Commonly known as: CLARITIN ?Take 1 tablet (10 mg total) by mouth daily. ?What changed:  ?when to take this ?reasons to take this ?  ?meclizine 25 MG tablet ?Commonly known as: ANTIVERT ?Take 1 tablet (25 mg total) by mouth 3 (three) times daily as needed for dizziness. ?  ?meloxicam 15 MG tablet ?Commonly known as: MOBIC ?Take 1 tablet (15 mg total) by mouth  daily. ?  ?rosuvastatin 20 MG tablet ?Commonly known as: CRESTOR ?Take 1 tablet (20 mg total) by mouth daily. ?Start taking on: September 27, 2021 ?  ?sodium chloride 0.65 % Soln nasal spray ?Commonly known as: OCEAN ?Place 1 spray into both nostrils 4 (four) times daily as needed for congestion. ?  ?tamsulosin 0.4 MG Caps capsule ?Commonly known as: Flomax ?Take 1 capsule (0.4 mg total) by mouth at bedtime. ?  ?traZODone 50 MG tablet ?Commonly known as: DESYREL ?Take 1-2 tabs PO qhs PRN ?What changed:  ?how much to take ?how to take this ?when to take this ?reasons to take this ?additional instructions ?  ? ?  ? ? ?LABORATORY STUDIES ?CBC ?   ?Component Value Date/Time  ? WBC 8.2 09/24/2021 0248  ? RBC 4.92 09/24/2021 0248  ? HGB 15.0 09/24/2021 0253  ? HCT 44.0 09/24/2021 0253  ? PLT 205 09/24/2021 0248  ? MCV 88.0 09/24/2021 0248  ? MCH 29.9 09/24/2021 0248  ? MCHC 33.9 09/24/2021 0248  ? RDW 12.6 09/24/2021 0248  ? LYMPHSABS 1.4 09/24/2021 0248  ? MONOABS 0.5 09/24/2021 0248  ? EOSABS 0.1 09/24/2021 0248  ? BASOSABS 0.1 09/24/2021 0248  ? ?CMP ?   ?Component Value Date/Time  ? NA 139 09/24/2021 0253  ? K 3.7 09/24/2021 0253  ? CL 100 09/24/2021 0253  ? CO2 25 09/24/2021  0248  ? GLUCOSE 130 (H) 09/24/2021 0253  ? BUN 17 09/24/2021 0253  ? CREATININE 0.90 09/24/2021 0253  ? CALCIUM 9.1 09/24/2021 0248  ? PROT 6.6 09/24/2021 0248  ? ALBUMIN 3.8 09/24/2021 0248  ? AST 17 09/24/2021 0248  ? ALT 24 09/24/2021 0248  ? ALKPHOS 45 09/24/2021 0248  ? BILITOT 0.8 09/24/2021 0248  ? GFRNONAA >60 09/24/2021 0248  ? ?COAGS ?Lab Results  ?Component Value Date  ? INR 1.0 09/24/2021  ? INR 0.9 01/16/2021  ? ?Lipid Panel ?   ?Component Value Date/Time  ? CHOL 189 09/24/2021 0248  ? TRIG 124 09/24/2021 0248  ? HDL 28 (L) 09/24/2021 0248  ? CHOLHDL 6.8 09/24/2021 0248  ? VLDL 25 09/24/2021 0248  ? LDLCALC 136 (H) 09/24/2021 0248  ? ?HgbA1C  ?Lab Results  ?Component Value Date  ? HGBA1C 5.9 (H) 09/24/2021  ? ?Urinalysis ?   ?Component  Value Date/Time  ? COLORURINE YELLOW 09/24/2021 1335  ? APPEARANCEUR CLEAR 09/24/2021 1335  ? LABSPEC 1.031 (H) 09/24/2021 1335  ? PHURINE 7.0 09/24/2021 1335  ? GLUCOSEU NEGATIVE 09/24/2021 1335  ? HGBUR NEGATIVE 09/24/2021 1335  ? BILIRUBINUR NEGATIVE 09/24/2021 1335  ? KETONESUR NEGATIVE 09/24/2021 1335  ? PROTEINUR NEGATIVE 09/24/2021 1335  ? NITRITE NEGATIVE 09/24/2021 1335  ? LEUKOCYTESUR NEGATIVE 09/24/2021 1335  ? ?Urine Drug Screen  ?   ?Component Value Date/Time  ? LABOPIA NONE DETECTED 09/24/2021 1335  ? COCAINSCRNUR NONE DETECTED 09/24/2021 1335  ? LABBENZ NONE DETECTED 09/24/2021 1335  ? AMPHETMU NONE DETECTED 09/24/2021 1335  ? THCU NONE DETECTED 09/24/2021 1335  ? LABBARB NONE DETECTED 09/24/2021 1335  ?  ?Alcohol Level ?   ?Component Value Date/Time  ? ETH <10 09/24/2021 0248  ? ? ? ?SIGNIFICANT DIAGNOSTIC STUDIES ?MR BRAIN WO CONTRAST ? ?Result Date: 09/24/2021 ?CLINICAL DATA:  Acute neuro deficit.  Posterior NK. EXAM: MRI HEAD WITHOUT CONTRAST TECHNIQUE: Multiplanar, multiecho pulse sequences of the brain and surrounding structures were obtained without intravenous contrast. COMPARISON:  CT head 09/24/2021 FINDINGS: Brain: No acute infarction, hemorrhage, hydrocephalus, extra-axial collection or mass lesion. Very mild white matter changes. Mild patchy hyperintensity in the pons. Vascular: Normal arterial flow voids. Skull and upper cervical spine: Negative Sinuses/Orbits: Mucosal edema paranasal sinuses.  Negative orbit Other: None IMPRESSION: Negative for acute infarct. Mild chronic microvascular ischemic change. Sinus mucosal edema. Electronically Signed   By: Allen Cooke M.D.   On: 09/24/2021 17:51  ? ?ECHOCARDIOGRAM COMPLETE ? ?Result Date: 09/24/2021 ?   ECHOCARDIOGRAM REPORT   Patient Name:   Allen Cooke [facility] Date of Exam: 09/24/2021 Medical Rec #:  [id number]             Height:       70.0 in Accession #:    [id number]            Weight:       243.6 lb Date of Birth:  1961/01/26              BSA:          2.270 m? Patient Age:    60 years              BP:           124/76 mmHg Patient Gender: M                     HR:           49 bpm. Exam Location:  Inpatient Procedure: 2D Echo, Cardiac Doppler and Color Doppler Indications:    Stroke  History:        Patient has no prior history of Echocardiogram examinations.                 Risk Factors:Hypertension.  Sonographer:    Eduard Rouxolleen Schwartz Referring Phys: [id number] [facility] IMPRESSIONS  1. Left ventricular ejection fraction, by estimation, is 60 to 65%. The left ventricle has normal function. The left ventricle has no regional wall motion abnormalities. There is mild left ventricular hypertrophy. Left ventricular diastolic parameters were normal.  2. Right ventricular systolic function is normal. The right ventricular size is normal. There is normal pulmonary artery systolic pressure. The estimated right ventricular systolic pressure is 21.0 mmHg.  3. Left atrial size was mild to moderately dilated.  4. The mitral valve is normal in structure. No evidence of mitral valve regurgitation. No evidence of mitral stenosis.  5. The aortic valve is tricuspid. Aortic valve regurgitation is trivial. No aortic stenosis is present.  6. The inferior vena cava is normal in size with greater than 50% respiratory variability, suggesting right atrial pressure of 3 mmHg. FINDINGS  Left Ventricle: Left ventricular ejection fraction, by estimation, is 60 to 65%. The left ventricle has normal function. The left ventricle has no regional wall motion abnormalities. The left ventricular internal cavity size was normal in size. There is  mild left ventricular hypertrophy. Left ventricular diastolic parameters were normal. Right Ventricle: The right ventricular size is normal. No increase in right ventricular wall thickness. Right ventricular systolic function is normal. There is normal pulmonary artery systolic pressure. The tricuspid regurgitant velocity is 2.12 m/s,  and  with an assumed right atrial pressure of 3 mmHg, the estimated right ventricular systolic pressure is 21.0 mmHg. Left Atrium: Left atrial size was mild to moderately dilated. Right Atrium: Right atrial s

## 2021-09-26 NOTE — Plan of Care (Signed)
?  Problem: Education: ?Goal: Knowledge of disease or condition will improve ?Outcome: Progressing ?Goal: Knowledge of secondary prevention will improve (SELECT ALL) ?Outcome: Progressing ?Goal: Knowledge of patient specific risk factors will improve (INDIVIDUALIZE FOR PATIENT) ?Outcome: Progressing ?Goal: Individualized Educational Video(s) ?Outcome: Progressing ?  ?Problem: Coping: ?Goal: Will verbalize positive feelings about self ?Outcome: Progressing ?Goal: Will identify appropriate support needs ?Outcome: Progressing ?  ?Problem: Health Behavior/Discharge Planning: ?Goal: Ability to manage health-related needs will improve ?Outcome: Progressing ?  ?Problem: Self-Care: ?Goal: Ability to participate in self-care as condition permits will improve ?Outcome: Progressing ?Goal: Verbalization of feelings and concerns over difficulty with self-care will improve ?Outcome: Progressing ?Goal: Ability to communicate needs accurately will improve ?Outcome: Progressing ?  ?Problem: Nutrition: ?Goal: Risk of aspiration will decrease ?Outcome: Progressing ?Goal: Dietary intake will improve ?Outcome: Progressing ?  ?Problem: Intracerebral Hemorrhage Tissue Perfusion: ?Goal: Complications of Intracerebral Hemorrhage will be minimized ?Outcome: Progressing ?  ?Problem: Ischemic Stroke/TIA Tissue Perfusion: ?Goal: Complications of ischemic stroke/TIA will be minimized ?Outcome: Progressing ?  ?Problem: Spontaneous Subarachnoid Hemorrhage Tissue Perfusion: ?Goal: Complications of Spontaneous Subarachnoid Hemorrhage will be minimized ?Outcome: Progressing ?  ?Problem: Education: ?Goal: Knowledge of General Education information will improve ?Description: Including pain rating scale, medication(s)/side effects and non-pharmacologic comfort measures ?Outcome: Progressing ?  ?Problem: Health Behavior/Discharge Planning: ?Goal: Ability to manage health-related needs will improve ?Outcome: Progressing ?  ?Problem: Clinical  Measurements: ?Goal: Ability to maintain clinical measurements within normal limits will improve ?Outcome: Progressing ?Goal: Will remain free from infection ?Outcome: Progressing ?Goal: Diagnostic test results will improve ?Outcome: Progressing ?Goal: Respiratory complications will improve ?Outcome: Progressing ?Goal: Cardiovascular complication will be avoided ?Outcome: Progressing ?  ?Problem: Activity: ?Goal: Risk for activity intolerance will decrease ?Outcome: Progressing ?  ?Problem: Nutrition: ?Goal: Adequate nutrition will be maintained ?Outcome: Progressing ?  ?Problem: Coping: ?Goal: Level of anxiety will decrease ?Outcome: Progressing ?  ?Problem: Elimination: ?Goal: Will not experience complications related to bowel motility ?Outcome: Progressing ?Goal: Will not experience complications related to urinary retention ?Outcome: Progressing ?  ?Problem: Pain Managment: ?Goal: General experience of comfort will improve ?Outcome: Progressing ?  ?Problem: Safety: ?Goal: Ability to remain free from injury will improve ?Outcome: Progressing ?  ?Problem: Skin Integrity: ?Goal: Risk for impaired skin integrity will decrease ?Outcome: Progressing ?  ?

## 2021-09-26 NOTE — Discharge Instructions (Addendum)
Mr. Vanosdol, you were admitted with acute onset vertigo, nausea and vomiting.  You were evaluated for a stroke, and given TNK to treat a possible stroke.  Your symptoms improved, and no stroke was seen on your MRI.  Your vertigo has improved and you will need to have home health PT support.  You will need to take aspirin and Plavix for 3 weeks followed by aspirin alone indefinitely.  You will take Crestor to control your high cholesterol. ?

## 2021-09-26 NOTE — Progress Notes (Signed)
Physical Therapy Treatment ?Patient Details ?Name: Allen Cooke ?MRN: [id number] ?DOB: [birth year] ?Today's Date: 09/26/2021 ? ? ?History of Present Illness 61 y.o. male with chief complaints of acute dizziness, vomiting, mild ataxia and  nystagmus. NIHSS 1; +TNK. MRI brain negative PMH: ETOH use, meningitis, prior right cerebellar strokes, anxiety. ? ?  ?PT Comments  ? ? Patient denies any spinning or "world shifting" symptoms since session 09/25/21. Able to ambulate 180 ft with light use of RW without imbalance or gait deviations. Patient with brief episode of nausea, but denied dizziness throughout. Discussed discharge plan and pt thinks he will stay with his sister for a few days. Discharge plan updated.  ?   ?Recommendations for follow up therapy are one component of a multi-disciplinary discharge planning process, led by the attending physician.  Recommendations may be updated based on patient status, additional functional criteria and insurance authorization. ? ?Follow Up Recommendations ? Home health PT (at sister's home) ?  ?  ?Assistance Recommended at Discharge Set up Supervision/Assistance  ?Patient can return home with the following A little help with walking and/or transfers ?  ?Equipment Recommendations ? Rolling walker (2 wheels)  ?  ?Recommendations for Other Services   ? ? ?  ?Precautions / Restrictions Precautions ?Precautions: Fall ?Precaution Comments: dizziness; nausea ?Restrictions ?Weight Bearing Restrictions: No  ?  ? ?Mobility ? Bed Mobility ?Overal bed mobility: Needs Assistance ?Bed Mobility: Supine to Sit ?  ?  ?Supine to sit: Modified independent (Device/Increase time) ?  ?  ?  ?  ? ?Transfers ?Overall transfer level: Needs assistance ?Equipment used: Rolling walker (2 wheels) ?Transfers: Sit to/from Stand ?Sit to Stand: Supervision ?  ?  ?  ?  ?  ?General transfer comment: patient denied dizziness; +mild nausea that quickly dissipated ?  ? ?Ambulation/Gait ?Ambulation/Gait  assistance: Min guard, Supervision ?Gait Distance (Feet): 180 Feet ?Assistive device: Rolling walker (2 wheels) ?Gait Pattern/deviations: Step-through pattern, Decreased stride length ?  ?  ?  ?General Gait Details: Gait much improved; vc to lighten his use of UEs on RW and pt progressed to very light grip without imbalance or drift ? ? ?Stairs ?  ?  ?  ?  ?  ? ? ?Wheelchair Mobility ?  ? ?Modified Rankin (Stroke Patients Only) ?Modified Rankin (Stroke Patients Only) ?Pre-Morbid Rankin Score: No symptoms ?Modified Rankin: Moderately severe disability ? ? ?  ?Balance Overall balance assessment: Needs assistance ?Sitting-balance support: No upper extremity supported, Feet supported ?Sitting balance-Leahy Scale: Fair ?  ?  ?Standing balance support: During functional activity, No upper extremity supported ?Standing balance-Leahy Scale: Fair ?  ?  ?  ?  ?  ?  ?  ?  ?  ?  ?  ?  ?  ? ?  ?Cognition Arousal/Alertness: Awake/alert ?Behavior During Therapy: [facility] for tasks assessed/performed ?Overall Cognitive Status: Within Functional Limits for tasks assessed ?  ?  ?  ?  ?  ?  ?  ?  ?  ?  ?  ?  ?  ?  ?  ?  ?  ?  ?  ? ?  ?Exercises Other Exercises ?Other Exercises: Reviewed x 1 exercises and issued new "A" cards to use as a target. Patient able to perform at slower speed x 30 seconds each direction with only mild nausea that went away within seconds of stopping exercise. ? ?  ?General Comments General comments (skin integrity, edema, etc.): Pt reported no change in his dizziness post-Epley maneuver (although denied  sense of world "skipping" today). ?  ?  ? ?Pertinent Vitals/Pain Pain Assessment ?Pain Assessment: 0-10 ?Pain Score: 6  ?Pain Location: right knee ?Pain Descriptors / Indicators: Guarding, Aching ?Pain Intervention(s): Limited activity within patient's tolerance, Monitored during session  ? ? ?Home Living   ?  ?  ?  ?  ?  ?  ?  ?  ?  ?   ?  ?Prior Function    ?  ?  ?   ? ?PT Goals (current goals can now be found in  the care plan section) Acute Rehab PT Goals ?Patient Stated Goal: to feel better ?Progress towards PT goals: Progressing toward goals ? ?  ?Frequency ? ? ? Min 3X/week ? ? ? ?  ?PT Plan Discharge plan needs to be updated  ? ? ?Co-evaluation   ?  ?  ?  ?  ? ?  ?AM-PAC PT "6 Clicks" Mobility   ?Outcome Measure ? Help needed turning from your back to your side while in a flat bed without using bedrails?: None ?Help needed moving from lying on your back to sitting on the side of a flat bed without using bedrails?: None ?Help needed moving to and from a bed to a chair (including a wheelchair)?: A Little ?Help needed standing up from a chair using your arms (e.g., wheelchair or bedside chair)?: A Little ?Help needed to walk in hospital room?: A Little ?Help needed climbing 3-5 steps with a railing? : A Little ?6 Click Score: 20 ? ?  ?End of Session Equipment Utilized During Treatment: Gait belt ?Activity Tolerance: Patient tolerated treatment well ?Patient left: in chair;with call bell/phone within reach;with chair alarm set ?  ?PT Visit Diagnosis: Muscle weakness (generalized) (M62.81);Dizziness and giddiness (R42);BPPV ?BPPV - Right/Left : Right ?  ? ? ?Time: 1127-1205 ?PT Time Calculation (min) (ACUTE ONLY): 38 min ? ?Charges:  $Gait Training: 23-37 mins ?$Therapeutic Exercise: 8-22 mins          ?          ? ? ?Arby Barrette, PT ?Acute Rehabilitation Services  ?Pager [phone] ?Office [phone] ? ? ? ?Allen Cooke ?09/26/2021, 12:23 PM ? ?

## 2021-09-26 NOTE — Progress Notes (Addendum)
STROKE TEAM PROGRESS NOTE  ? ?INTERVAL HISTORY ?The patient is seen in his room this morning with no family at bedside. He has been hemodynamically stable and his neurological exam is stable.  He states that his dizziness has improved but still does not feel safe to go home. ?Vitals:  ? 09/26/21 0600 09/26/21 0632 09/26/21 0724 09/26/21 1224  ?BP: (!) 158/85 131/89 125/81 (!) 108/91  ?Pulse: 65 (!) 59 64 75  ?Resp: 18 20 15 20   ?Temp:  98.3 ?F (36.8 ?C) 98.2 ?F (36.8 ?C) 98.3 ?F (36.8 ?C)  ?TempSrc:  Oral Oral Oral  ?SpO2: 91% 93%  91%  ?Weight:      ? ?CBC:  ?Recent Labs  ?Lab 09/24/21 ?0248 09/24/21 ?0253  ?WBC 8.2  --   ?NEUTROABS 6.1  --   ?HGB 14.7 15.0  ?HCT 43.3 44.0  ?MCV 88.0  --   ?PLT 205  --   ? ? ?Basic Metabolic Panel:  ?Recent Labs  ?Lab 09/24/21 ?0248 09/24/21 ?0253  ?NA 136 139  ?K 3.6 3.7  ?CL 102 100  ?CO2 25  --   ?GLUCOSE 129* 130*  ?BUN 15 17  ?CREATININE 0.98 0.90  ?CALCIUM 9.1  --   ? ? ?Lipid Panel:  ?Recent Labs  ?Lab 09/24/21 ?0248  ?CHOL 189  ?TRIG 124  ?HDL 28*  ?CHOLHDL 6.8  ?VLDL 25  ?Cohoes 136*  ? ? ?HgbA1c:  ?Recent Labs  ?Lab 09/24/21 ?0248  ?HGBA1C 5.9*  ? ? ?Urine Drug Screen:  ?Recent Labs  ?Lab 09/24/21 ?1335  ?LABOPIA NONE DETECTED  ?COCAINSCRNUR NONE DETECTED  ?LABBENZ NONE DETECTED  ?AMPHETMU NONE DETECTED  ?THCU NONE DETECTED  ?LABBARB NONE DETECTED  ? ?  ?Alcohol Level  ?Recent Labs  ?Lab 09/24/21 ?0248  ?ETH <10  ? ? ? ?IMAGING past 24 hours ?No results found. ? ?PHYSICAL EXAM ? ?Physical Exam  ?Constitutional: Appears obese middle-age Caucasian male.  Not in distress. ?Cardiovascular: Normal rate and regular rhythm.  ?Respiratory: Effort normal, non-labored breathing ? ?Neuro: ?Mental Status: ?Patient is awake, alert, oriented to person, place, month, year, and situation. ?Patient is able to give a clear and coherent history. ?No signs of aphasia or neglect ?Cranial Nerves: ?II: Visual Fields are full ?III,IV, VI: EOMI with no nystagmus ?V: Facial sensation is symmetric  to temperature ?VII: Facial movement is symmetric resting and smiling ?VIII: Hearing is intact to voice ?X: Palate elevates symmetrically ?XI: Shoulder shrug is symmetric. ?XII: Tongue protrudes midline without atrophy or fasciculations.  ?Motor: ?Tone is normal. Bulk is normal. 5/5 strength was present in all four extremities.  ?Sensory: ?Sensation is symmetric to light touch in all four extremities.  ?Cerebellar: ?FNF intact bilaterally ?  ? ? ?ASSESSMENT/PLAN ?Allen Cooke is a 61 y.o. 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 NIHSS of 1, but patient was unable to ambulate. CTH negative. Was given TNK. MRI negative for acute infarct.  ? ?TIA - cerebellar/brainstem stroke symptoms aborted with TNK ?Code Stroke CT head: No acute abnormality.  ?CTA head & neck: No LVO, no significant stenosis ?MRI: no infarct ?2D Echo: EF 60-65%, moderate LAE, no thrombus or interatrial shunt ?LDL 136 ?HgbA1c 5.9 ?UDS negative ?VTE prophylaxis - SCDs ?No antithrombotic prior to admission, now on ASA and Plavix for 3 weeks and then aspirin alone ?Therapy recommendations:  HH ?Disposition:  TBD ? ?Hypertension ?Home  meds:  amlodipine, hydrochlorothiazide ?Stable ?Gradually normalize BP in 2 to 3 days ?Avoid low BP ?Long-term BP goal normotensive ? ?Hyperlipidemia ?Home meds:  NA ?LDL 136, goal < 70 ?Add Crestor 20mg   ?Continue statin at discharge ? ?Other Stroke Risk Factors ?ETOH use, alcohol level <10, advised to drink no more than 1 drink a day ?Obesity, Body mass index is 34.95 kg/m?., BMI >/= 30 associated with increased stroke risk, recommend weight loss, diet and exercise as appropriate  ?Suspected obstructive sleep apnea, not on CPAP at home ? ?Other acute issues ?History of meningitis ? ? ?Hospital day # 2 ? ? ?[place] , MSN, AGACNP-BC ?[facility] ?See  Amion for schedule and pager information ?09/26/2021 3:24 PM ?  ?ATTENDING NOTE: ?I reviewed above note and agree with the assessment and plan. Pt was seen and examined.  ? ?61 year old male with history of alcohol abuse, meningitis admitted for vertigo, nausea vomiting, nystagmus and imbalance gait.  CT no acute abnormality.  Status post TNK.  CT head and neck unremarkable.  MRI no acute infarct.  EF 60 to 65%, LDL 136, A1c 5.9.  UDS negative.  Creatinine 0.90. ? ?Patient symptoms likely due to TIA, stroke symptoms aborted by TNK.  Etiology concerning for uncontrolled risk factors.  Recommend aggressive risk factor modification, continue aspirin 81 and the Plavix 75 DAPT for 3 weeks and then aspirin alone.  Continue Crestor 20.  PT/OT ?Home health. ? ?For detailed assessment and plan, please refer to above as I have made changes wherever appropriate.  ? ?Rosalin Hawking, MD PhD ?Stroke Neurology ?09/26/2021 ?5:33 PM ? ? ? ?To contact Stroke Continuity provider, please refer to [email or web address] ?After hours, contact General Neurology ? ?

## 2021-09-26 NOTE — TOC Transition Note (Signed)
Transition of Care (TOC) - CM/SW Discharge Note ? ? ?Patient Details  ?Name: Allen Cooke ?MRN: [id number] ?Date of Birth: 12-09-1960 ? ?Transition of Care (TOC) CM/SW Contact:  ?Lawerance Sabal, RN ?Phone Number: ?09/26/2021, 12:52 PM ? ? ?Clinical Narrative:   Spoke w patient, he states that he will stay at his sister's house for a few days after DC then [place] streets. Verified address on chart. Explained that HH cannot be set up due to no address, he verbalized understanding. ?Will get RW delivered to room to take with him.  ?He verbalized that he will need a ride home. Assessed and Taxi voucher most appropriate. Will also give 2 bus passes so he/ his sister can get meds from pharmacy.  ? ?Added [facility] on [address] to profile so meds for DC can get sent there. Will follow for MATCH needs.  ?[facility]  ? ? ? ? ? ? ?Final next level of care: Home/Self Care ?Barriers to Discharge: No Barriers Identified ? ? ?Patient Goals and CMS Choice ?Patient states their goals for this hospitalization and ongoing recovery are:: "get healthy" ?  ?  ? ?Discharge Placement ?  ?           ?  ?  ?  ?  ? ?Discharge Plan and Services ?In-house Referral: Clinical Social Work ?  ?Post Acute Care Choice:  (TBD)          ?DME Arranged: Walker rolling ?DME Agency: [employer] ?Date DME Agency Contacted: 09/26/21 ?Time DME Agency Contacted: 1251 ?Representative spoke with at DME Agency: Leavy Cella ?  ?  ?  ?  ?  ? ?Social Determinants of Health (SDOH) Interventions ?  ? ? ?Readmission Risk Interventions ?   ? View : No data to display.  ?  ?  ?  ? ? ? ? ? ?

## 2021-09-27 DIAGNOSIS — Z9282 Status post administration of tPA (rtPA) in a different facility within the last 24 hours prior to admission to current facility: Secondary | ICD-10-CM

## 2021-09-27 DIAGNOSIS — G459 Transient cerebral ischemic attack, unspecified: Principal | ICD-10-CM

## 2021-09-27 NOTE — Progress Notes (Signed)
Physical Therapy Treatment ?Patient Details ?Name: Allen Cooke ?MRN: [id number] ?DOB: December 13, 1960 ?Today's Date: 09/27/2021 ? ? ?History of Present Illness 61 y.o. male with chief complaints of acute dizziness, vomiting, mild ataxia and  nystagmus. NIHSS 1; +TNK. MRI brain negative PMH: ETOH use, meningitis, prior right cerebellar strokes, anxiety. ? ?  ?PT Comments  ? ? Patient improving greatly but still has symptoms of peripheral vestibular hypofunction (only occurred for ~1 second while walking today).  Patient education re: hypofunction provided in attempts to explain importance of doing his exercises. Patient asking why this developed and explained that it is often triggered by a virus or "head cold." Patient reporting 6 month h/o sinus "blockage" and has been seeing PCP with no improvement. Patient interested in better understanding his sinus problems as these have been new in the past 6 months and not improved with medication. Educated pt to discuss with MD.  ?  ?Recommendations for follow up therapy are one component of a multi-disciplinary discharge planning process, led by the attending physician.  Recommendations may be updated based on patient status, additional functional criteria and insurance authorization. ? ?Follow Up Recommendations ? Other (comment) (per Case Manager cannot receive HHPT without a permanent address and not knowing how long he'll stay with sister (pt reports only 2 days)) ?  ?  ?Assistance Recommended at Discharge PRN  ?Patient can return home with the following A little help with walking and/or transfers ?  ?Equipment Recommendations ? Rolling walker (2 wheels)  ?  ?Recommendations for Other Services   ? ? ?  ?Precautions / Restrictions Precautions ?Precautions: Fall ?Precaution Comments: dizziness; nausea ?Restrictions ?Weight Bearing Restrictions: No  ?  ? ?Mobility ? Bed Mobility ?Overal bed mobility: Needs Assistance ?Bed Mobility: Supine to Sit ?  ?  ?Supine to sit:  Modified independent (Device/Increase time) ?  ?  ?General bed mobility comments: no dizziness throughout bed mobility ?  ? ?Transfers ?Overall transfer level: Modified independent ?Equipment used: Rolling walker (2 wheels) ?Transfers: Sit to/from Stand ?Sit to Stand: Modified independent (Device/Increase time) ?  ?  ?  ?  ?  ?General transfer comment: patient denied dizziness; ?  ? ?Ambulation/Gait ?Ambulation/Gait assistance: Supervision, Modified independent (Device/Increase time) ?Gait Distance (Feet): 200 Feet ?Assistive device: Rolling walker (2 wheels) ?Gait Pattern/deviations: Step-through pattern, Decreased stride length ?  ?  ?  ?General Gait Details: Gait much improved; vc to lighten his use of UEs on RW and pt progressed to very light grip without imbalance or drift; experienced one instance of "world jumped to the right" but without imbalance ? ? ?Stairs ?  ?  ?  ?  ?  ? ? ?Wheelchair Mobility ?  ? ?Modified Rankin (Stroke Patients Only) ?Modified Rankin (Stroke Patients Only) ?Pre-Morbid Rankin Score: No symptoms ?Modified Rankin: Moderately severe disability ? ? ?  ?Balance Overall balance assessment: Needs assistance ?Sitting-balance support: No upper extremity supported, Feet supported ?Sitting balance-Leahy Scale: Fair ?  ?  ?Standing balance support: During functional activity, No upper extremity supported ?Standing balance-Leahy Scale: Fair ?Standing balance comment: relies on UE support very little ?  ?  ?  ?  ?  ?  ?  ?  ?  ?  ?  ?  ? ?  ?Cognition Arousal/Alertness: Awake/alert ?Behavior During Therapy: [facility] for tasks assessed/performed ?Overall Cognitive Status: Within Functional Limits for tasks assessed ?  ?  ?  ?  ?  ?  ?  ?  ?  ?  ?  ?  ?  ?  ?  ?  ?  ?  ?  ? ?  ?  Exercises Other Exercises ?Other Exercises: Reviewed x 1 exercises. Due to neck pain pt unable to turn his head quickly enough to elicit reflex or cause dizziness. Educated that he needs to work on increasing head turn/nod  velocity and turn head until he feels moderately dizzy for exercise to be effective. ? ?  ?General Comments General comments (skin integrity, edema, etc.): Educated on potential cause of hypofunction is viral and pt relays that he has been having sinus issues for 6 months. Has seen MD with meds tried and no relief ?  ?  ? ?Pertinent Vitals/Pain Pain Assessment ?Pain Assessment: 0-10 ?Pain Score: 7  ?Pain Location: neck ?Pain Descriptors / Indicators: Guarding, Discomfort, Tightness ?Pain Intervention(s): Limited activity within patient's tolerance, Monitored during session, Repositioned, Other (comment) (gentle neck ROM)  ? ? ?Home Living   ?  ?  ?  ?  ?  ?  ?  ?  ?  ?   ?  ?Prior Function    ?  ?  ?   ? ?PT Goals (current goals can now be found in the care plan section) Acute Rehab PT Goals ?Patient Stated Goal: to feel better ?Progress towards PT goals: Progressing toward goals ? ?  ?Frequency ? ? ? Min 3X/week ? ? ? ?  ?PT Plan Discharge plan needs to be updated  ? ? ?Co-evaluation   ?  ?  ?  ?  ? ?  ?AM-PAC PT "6 Clicks" Mobility   ?Outcome Measure ? Help needed turning from your back to your side while in a flat bed without using bedrails?: None ?Help needed moving from lying on your back to sitting on the side of a flat bed without using bedrails?: None ?Help needed moving to and from a bed to a chair (including a wheelchair)?: None ?Help needed standing up from a chair using your arms (e.g., wheelchair or bedside chair)?: None ?Help needed to walk in hospital room?: A Little ?Help needed climbing 3-5 steps with a railing? : A Little ?6 Click Score: 22 ? ?  ?End of Session Equipment Utilized During Treatment: Gait belt ?Activity Tolerance: Patient tolerated treatment well ?Patient left: in chair;with call bell/phone within reach;with chair alarm set ?Nurse Communication: Mobility status ?PT Visit Diagnosis: Muscle weakness (generalized) (M62.81);Dizziness and giddiness (R42);BPPV ?BPPV - Right/Left : Right ?   ? ? ?Time: [id number] ?PT Time Calculation (min) (ACUTE ONLY): 25 min ? ?Charges:  $Gait Training: 8-22 mins ?$Therapeutic Exercise: 8-22 mins          ?          ? ? ?Arby Barrette, PT ?Acute Rehabilitation Services  ?Pager [phone] ?Office [phone] ? ? ? ?Jeanie Cooks Feige Lowdermilk ?09/27/2021, 10:02 AM ? ?

## 2021-09-27 NOTE — Progress Notes (Signed)
Discharge instructions complete. Meds, diet, activity, follow up appointments reviewed and all questions answered. Stroke signs and symptoms reviewed. Copy of instructions given to patient and prescriptions sent to pharmacy.  ?

## 2021-09-27 NOTE — Discharge Summary (Signed)
Stroke Discharge Summary  ?Patient ID: Allen Cooke    l ?  MRN: [id number]    ?  DOB: [birth year] ? ?Date of Admission: 09/24/2021 ?Date of Discharge: 09/27/2021 ? ?Attending Physician:  No att. providers found, Stroke MD ?Consultant(s):    None  ?Patient's PCP:  Marliss Coots, NP ? 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 ?Allergies as of 09/27/2021   ?No Known Allergies ?  ? ?  ?Medication List  ?  ? ?STOP taking these medications   ? ?amoxicillin-clavulanate 875-125 MG tablet ?Commonly known as: AUGMENTIN ?  ? ?  ? ?TAKE these medications   ? ?amLODipine 5 MG tablet ?Commonly known as: NORVASC ?Take 1 tablet (5 mg total) by mouth daily. ?Notes to patient: Due 09/27/21 8am ?  ?aspirin 81 MG EC tablet ?Take 1 tablet (81 mg total) by mouth daily. Swallow whole. ?  ?citalopram 20 MG tablet ?Commonly known as: CeleXA ?Take 1 tablet (20 mg total) by mouth daily. ?  ?clopidogrel 75 MG tablet ?Commonly known as: PLAVIX ?Take 1 tablet (75 mg total) by mouth daily. ?  ?hydrochlorothiazide 50 MG tablet ?Commonly known as: HYDRODIURIL ?Take 1 tablet (50 mg total) by mouth daily. ?  ?hydrOXYzine 25 MG capsule ?Commonly known as: Vistaril ?Take 1 capsule (25 mg total) by mouth 3 (three) times daily as needed for anxiety. ?  ?Lasix 20 MG tablet ?Generic drug: furosemide ?Take 20 mg by mouth daily as needed for fluid or edema. ?  ?loratadine 10 MG tablet ?Commonly known as: CLARITIN ?Take 1 tablet (10 mg total) by mouth daily. ?What changed:  ?when to take this ?reasons to take this ?  ?meclizine 25 MG tablet ?Commonly known as: ANTIVERT ?Take 1 tablet (25 mg total) by mouth 3 (three) times daily as needed for dizziness. ?  ?meloxicam 15 MG tablet ?Commonly known as: MOBIC ?Take 1 tablet (15 mg total) by mouth daily. ?  ?rosuvastatin 20 MG tablet ?Commonly known as: CRESTOR ?Take 1 tablet (20 mg total) by mouth daily. ?  ?sodium chloride 0.65 % Soln nasal spray ?Commonly known  as: OCEAN ?Place 1 spray into both nostrils 4 (four) times daily as needed for congestion. ?  ?tamsulosin 0.4 MG Caps capsule ?Commonly known as: Flomax ?Take 1 capsule (0.4 mg total) by mouth at bedtime. ?  ?traZODone 50 MG tablet ?Commonly known as: DESYREL ?Take 1-2 tabs PO qhs PRN ?What changed:  ?how much to take ?how to take this ?when to take this ?reasons to take this ?additional instructions ?  ? ?  ? ?  ?  ? ? ?  ?Durable Medical Equipment  ?(From admission, onward)  ?  ? ? ?  ? ?  Start     Ordered  ? 09/26/21 1304  For home use only DME Walker rolling  Once       ?Question Answer Comment  ?Walker: With 5 Inch Wheels   ?Patient needs a walker to treat with the following condition Weakness   ?  ? 09/26/21 1303  ? ?  ?  ? ?  ? ? ?LABORATORY STUDIES ?CBC ?   ?Component Value Date/Time  ? WBC 8.2 09/24/2021 0248  ? RBC 4.92 09/24/2021 0248  ? HGB 15.0 09/24/2021 0253  ? HCT 44.0 09/24/2021 0253  ? PLT 205 09/24/2021 0248  ? MCV 88.0 09/24/2021 0248  ? MCH 29.9 09/24/2021 0248  ? MCHC 33.9 09/24/2021 0248  ? RDW 12.6 09/24/2021  0248  ? LYMPHSABS 1.4 09/24/2021 0248  ? MONOABS 0.5 09/24/2021 0248  ? EOSABS 0.1 09/24/2021 0248  ? BASOSABS 0.1 09/24/2021 0248  ? ?CMP ?   ?Component Value Date/Time  ? NA 139 09/24/2021 0253  ? K 3.7 09/24/2021 0253  ? CL 100 09/24/2021 0253  ? CO2 25 09/24/2021 0248  ? GLUCOSE 130 (H) 09/24/2021 0253  ? BUN 17 09/24/2021 0253  ? CREATININE 0.90 09/24/2021 0253  ? CALCIUM 9.1 09/24/2021 0248  ? PROT 6.6 09/24/2021 0248  ? ALBUMIN 3.8 09/24/2021 0248  ? AST 17 09/24/2021 0248  ? ALT 24 09/24/2021 0248  ? ALKPHOS 45 09/24/2021 0248  ? BILITOT 0.8 09/24/2021 0248  ? GFRNONAA >60 09/24/2021 0248  ? ?COAGS ?Lab Results  ?Component Value Date  ? INR 1.0 09/24/2021  ? INR 0.9 01/16/2021  ? ?Lipid Panel ?   ?Component Value Date/Time  ? CHOL 189 09/24/2021 0248  ? TRIG 124 09/24/2021 0248  ? HDL 28 (L) 09/24/2021 0248  ? CHOLHDL 6.8 09/24/2021 0248  ? VLDL 25 09/24/2021 0248  ? LDLCALC 136  (H) 09/24/2021 0248  ? ?HgbA1C  ?Lab Results  ?Component Value Date  ? HGBA1C 5.9 (H) 09/24/2021  ? ?Urinalysis ?   ?Component Value Date/Time  ? [place] YELLOW 09/24/2021 1335  ? APPEARANCEUR CLEAR 09/24/2021 1335  ? LABSPEC 1.031 (H) 09/24/2021 1335  ? PHURINE 7.0 09/24/2021 1335  ? GLUCOSEU NEGATIVE 09/24/2021 1335  ? [place] NEGATIVE 09/24/2021 1335  ? [place] NEGATIVE 09/24/2021 1335  ? [place] NEGATIVE 09/24/2021 1335  ? [place] NEGATIVE 09/24/2021 1335  ? NITRITE NEGATIVE 09/24/2021 1335  ? LEUKOCYTESUR NEGATIVE 09/24/2021 1335  ? ?Urine Drug Screen  ?   ?Component Value Date/Time  ? [place] DETECTED 09/24/2021 1335  ? [place] DETECTED 09/24/2021 1335  ? [place] DETECTED 09/24/2021 1335  ? AMPHETMU NONE DETECTED 09/24/2021 1335  ? [place] DETECTED 09/24/2021 1335  ? [place] DETECTED 09/24/2021 1335  ?  ?Alcohol Level ?   ?Component Value Date/Time  ? ETH <10 09/24/2021 0248  ? ? ? ?SIGNIFICANT DIAGNOSTIC STUDIES ?MR BRAIN WO CONTRAST ? ?Result Date: 09/24/2021 ?CLINICAL DATA:  Acute neuro deficit.  Posterior NK. EXAM: MRI HEAD WITHOUT CONTRAST TECHNIQUE: Multiplanar, multiecho pulse sequences of the brain and surrounding structures were obtained without intravenous contrast. COMPARISON:  CT head 09/24/2021 FINDINGS: Brain: No acute infarction, hemorrhage, hydrocephalus, extra-axial collection or mass lesion. Very mild white matter changes. Mild patchy hyperintensity in the pons. Vascular: Normal arterial flow voids. Skull and upper cervical spine: Negative Sinuses/Orbits: Mucosal edema paranasal sinuses.  Negative orbit Other: None IMPRESSION: Negative for acute infarct. Mild chronic microvascular ischemic change. Sinus mucosal edema. Electronically Signed   By: Franchot Gallo M.D.   On: 09/24/2021 17:51  ? ?ECHOCARDIOGRAM COMPLETE ? ?Result Date: 09/24/2021 ?   ECHOCARDIOGRAM REPORT   Patient Name:   Allen Cooke [facility] Date of Exam: 09/24/2021 Medical Rec #:  [id number]              Height:       70.0 in Accession #:    [id number]            Weight:       243.6 lb Date of Birth:  [birth year]             BSA:          2.270 m? Patient Age:    61 years  BP:           124/76 mmHg Patient Gender: M                     HR:           49 bpm. Exam Location:  Inpatient Procedure: 2D Echo, Cardiac Doppler and Color Doppler Indications:    Stroke  History:        Patient has no prior history of Echocardiogram examinations.                 Risk Factors:Hypertension.  Sonographer:    Jefferey Pica Referring Phys: [id number] [place]  1. Left ventricular ejection fraction, by estimation, is 60 to 65%. The left ventricle has normal function. The left ventricle has no regional wall motion abnormalities. There is mild left ventricular hypertrophy. Left ventricular diastolic parameters were normal.  2. Right ventricular systolic function is normal. The right ventricular size is normal. There is normal pulmonary artery systolic pressure. The estimated right ventricular systolic pressure is [id number] mmHg.  3. Left atrial size was mild to moderately dilated.  4. The mitral valve is normal in structure. No evidence of mitral valve regurgitation. No evidence of mitral stenosis.  5. The aortic valve is tricuspid. Aortic valve regurgitation is trivial. No aortic stenosis is present.  6. The inferior vena cava is normal in size with greater than 50% respiratory variability, suggesting right atrial pressure of 3 mmHg. FINDINGS  Left Ventricle: Left ventricular ejection fraction, by estimation, is 60 to 65%. The left ventricle has normal function. The left ventricle has no regional wall motion abnormalities. The left ventricular internal cavity size was normal in size. There is  mild left ventricular hypertrophy. Left ventricular diastolic parameters were normal. Right Ventricle: The right ventricular size is normal. No increase in right ventricular wall thickness. Right ventricular  systolic function is normal. There is normal pulmonary artery systolic pressure. The tricuspid regurgitant velocity is 2.12 m/s, and  with an assumed right atrial pressure of 3 mmHg, the estimated right

## 2021-09-27 NOTE — Progress Notes (Signed)
Stroke Discharge Summary  ?Patient ID: Allen Cooke    l ?  MRN: [id number]    ?  DOB: April 28, 1961 ? ?Date of Admission: 09/24/2021 ?Date of Discharge: 09/27/2021 ? ?Attending Physician:  Stroke, Md, MD, Stroke MD ?Consultant(s):    None  ?Patient's PCP:  Lavinia Sharps, NP ? 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 ?Allergies as of 09/27/2021   ?No Known Allergies ?  ? ?  ?Medication List  ?  ? ?STOP taking these medications   ? ?amoxicillin-clavulanate 875-125 MG tablet ?Commonly known as: AUGMENTIN ?  ? ?  ? ?TAKE these medications   ? ?amLODipine 5 MG tablet ?Commonly known as: NORVASC ?Take 1 tablet (5 mg total) by mouth daily. ?  ?aspirin 81 MG EC tablet ?Take 1 tablet (81 mg total) by mouth daily. Swallow whole. ?  ?citalopram 20 MG tablet ?Commonly known as: CeleXA ?Take 1 tablet (20 mg total) by mouth daily. ?  ?clopidogrel 75 MG tablet ?Commonly known as: PLAVIX ?Take 1 tablet (75 mg total) by mouth daily. ?  ?hydrochlorothiazide 50 MG tablet ?Commonly known as: HYDRODIURIL ?Take 1 tablet (50 mg total) by mouth daily. ?  ?hydrOXYzine 25 MG capsule ?Commonly known as: Vistaril ?Take 1 capsule (25 mg total) by mouth 3 (three) times daily as needed for anxiety. ?  ?Lasix 20 MG tablet ?Generic drug: furosemide ?Take 20 mg by mouth daily as needed for fluid or edema. ?  ?loratadine 10 MG tablet ?Commonly known as: CLARITIN ?Take 1 tablet (10 mg total) by mouth daily. ?What changed:  ?when to take this ?reasons to take this ?  ?meclizine 25 MG tablet ?Commonly known as: ANTIVERT ?Take 1 tablet (25 mg total) by mouth 3 (three) times daily as needed for dizziness. ?  ?meloxicam 15 MG tablet ?Commonly known as: MOBIC ?Take 1 tablet (15 mg total) by mouth daily. ?  ?rosuvastatin 20 MG tablet ?Commonly known as: CRESTOR ?Take 1 tablet (20 mg total) by mouth daily. ?  ?sodium chloride 0.65 % Soln nasal spray ?Commonly known as: OCEAN ?Place 1 spray into both nostrils 4  (four) times daily as needed for congestion. ?  ?tamsulosin 0.4 MG Caps capsule ?Commonly known as: Flomax ?Take 1 capsule (0.4 mg total) by mouth at bedtime. ?  ?traZODone 50 MG tablet ?Commonly known as: DESYREL ?Take 1-2 tabs PO qhs PRN ?What changed:  ?how much to take ?how to take this ?when to take this ?reasons to take this ?additional instructions ?  ? ?  ? ?  ?  ? ? ?  ?Durable Medical Equipment  ?(From admission, onward)  ?  ? ? ?  ? ?  Start     Ordered  ? 09/26/21 1304  For home use only DME Walker rolling  Once       ?Question Answer Comment  ?Walker: With 5 Inch Wheels   ?Patient needs a walker to treat with the following condition Weakness   ?  ? 09/26/21 1303  ? ?  ?  ? ?  ? ? ?LABORATORY STUDIES ?CBC ?   ?Component Value Date/Time  ? WBC 8.2 09/24/2021 0248  ? RBC 4.92 09/24/2021 0248  ? HGB 15.0 09/24/2021 0253  ? HCT 44.0 09/24/2021 0253  ? PLT 205 09/24/2021 0248  ? MCV 88.0 09/24/2021 0248  ? MCH 29.9 09/24/2021 0248  ? MCHC 33.9 09/24/2021 0248  ? RDW 12.6 09/24/2021 0248  ? LYMPHSABS 1.4 09/24/2021 0248  ?  MONOABS 0.5 09/24/2021 0248  ? EOSABS 0.1 09/24/2021 0248  ? BASOSABS 0.1 09/24/2021 0248  ? ?CMP ?   ?Component Value Date/Time  ? NA 139 09/24/2021 0253  ? K 3.7 09/24/2021 0253  ? CL 100 09/24/2021 0253  ? CO2 25 09/24/2021 0248  ? GLUCOSE 130 (H) 09/24/2021 0253  ? BUN 17 09/24/2021 0253  ? CREATININE 0.90 09/24/2021 0253  ? CALCIUM 9.1 09/24/2021 0248  ? PROT 6.6 09/24/2021 0248  ? ALBUMIN 3.8 09/24/2021 0248  ? AST 17 09/24/2021 0248  ? ALT 24 09/24/2021 0248  ? ALKPHOS 45 09/24/2021 0248  ? BILITOT 0.8 09/24/2021 0248  ? GFRNONAA >60 09/24/2021 0248  ? ?COAGS ?Lab Results  ?Component Value Date  ? INR 1.0 09/24/2021  ? INR 0.9 01/16/2021  ? ?Lipid Panel ?   ?Component Value Date/Time  ? CHOL 189 09/24/2021 0248  ? TRIG 124 09/24/2021 0248  ? HDL 28 (L) 09/24/2021 0248  ? CHOLHDL 6.8 09/24/2021 0248  ? VLDL 25 09/24/2021 0248  ? LDLCALC 136 (H) 09/24/2021 0248  ? ?HgbA1C  ?Lab Results   ?Component Value Date  ? HGBA1C 5.9 (H) 09/24/2021  ? ?Urinalysis ?   ?Component Value Date/Time  ? [place] YELLOW 09/24/2021 1335  ? APPEARANCEUR CLEAR 09/24/2021 1335  ? LABSPEC 1.031 (H) 09/24/2021 1335  ? PHURINE 7.0 09/24/2021 1335  ? GLUCOSEU NEGATIVE 09/24/2021 1335  ? HGBUR NEGATIVE 09/24/2021 1335  ? BILIRUBINUR NEGATIVE 09/24/2021 1335  ? KETONESUR NEGATIVE 09/24/2021 1335  ? PROTEINUR NEGATIVE 09/24/2021 1335  ? NITRITE NEGATIVE 09/24/2021 1335  ? LEUKOCYTESUR NEGATIVE 09/24/2021 1335  ? ?Urine Drug Screen  ?   ?Component Value Date/Time  ? LABOPIA NONE DETECTED 09/24/2021 1335  ? COCAINSCRNUR NONE DETECTED 09/24/2021 1335  ? LABBENZ NONE DETECTED 09/24/2021 1335  ? AMPHETMU NONE DETECTED 09/24/2021 1335  ? THCU NONE DETECTED 09/24/2021 1335  ? LABBARB NONE DETECTED 09/24/2021 1335  ?  ?Alcohol Level ?   ?Component Value Date/Time  ? ETH <10 09/24/2021 0248  ? ? ? ?SIGNIFICANT DIAGNOSTIC STUDIES ?MR BRAIN WO CONTRAST ? ?Result Date: 09/24/2021 ?CLINICAL DATA:  Acute neuro deficit.  Posterior NK. EXAM: MRI HEAD WITHOUT CONTRAST TECHNIQUE: Multiplanar, multiecho pulse sequences of the brain and surrounding structures were obtained without intravenous contrast. COMPARISON:  CT head 09/24/2021 FINDINGS: Brain: No acute infarction, hemorrhage, hydrocephalus, extra-axial collection or mass lesion. Very mild white matter changes. Mild patchy hyperintensity in the pons. Vascular: Normal arterial flow voids. Skull and upper cervical spine: Negative Sinuses/Orbits: Mucosal edema paranasal sinuses.  Negative orbit Other: None IMPRESSION: Negative for acute infarct. Mild chronic microvascular ischemic change. Sinus mucosal edema. Electronically Signed   By: Marlan Palau M.D.   On: 09/24/2021 17:51  ? ?ECHOCARDIOGRAM COMPLETE ? ?Result Date: 09/24/2021 ?   ECHOCARDIOGRAM REPORT   Patient Name:   Allen Cooke [facility] Date of Exam: 09/24/2021 Medical Rec #:  [id number]             Height:       70.0 in Accession #:     [id number]            Weight:       243.6 lb Date of Birth:  [birth year]             BSA:          2.270 m? Patient Age:    60 years              BP:  124/76 mmHg Patient Gender: M                     HR:           49 bpm. Exam Location:  Inpatient Procedure: 2D Echo, Cardiac Doppler and Color Doppler Indications:    Stroke  History:        Patient has no prior history of Echocardiogram examinations.                 Risk Factors:Hypertension.  Sonographer:    Eduard Rouxolleen Schwartz Referring Phys: [id number] [facility] K[facility] IMPRESSIONS  1. Left ventricular ejection fraction, by estimation, is 60 to 65%. The left ventricle has normal function. The left ventricle has no regional wall motion abnormalities. There is mild left ventricular hypertrophy. Left ventricular diastolic parameters were normal.  2. Right ventricular systolic function is normal. The right ventricular size is normal. There is normal pulmonary artery systolic pressure. The estimated right ventricular systolic pressure is 21.0 mmHg.  3. Left atrial size was mild to moderately dilated.  4. The mitral valve is normal in structure. No evidence of mitral valve regurgitation. No evidence of mitral stenosis.  5. The aortic valve is tricuspid. Aortic valve regurgitation is trivial. No aortic stenosis is present.  6. The inferior vena cava is normal in size with greater than 50% respiratory variability, suggesting right atrial pressure of 3 mmHg. FINDINGS  Left Ventricle: Left ventricular ejection fraction, by estimation, is 60 to 65%. The left ventricle has normal function. The left ventricle has no regional wall motion abnormalities. The left ventricular internal cavity size was normal in size. There is  mild left ventricular hypertrophy. Left ventricular diastolic parameters were normal. Right Ventricle: The right ventricular size is normal. No increase in right ventricular wall thickness. Right ventricular systolic function is normal. There is normal  pulmonary artery systolic pressure. The tricuspid regurgitant velocity is 2.12 m/s, and  with an assumed right atrial pressure of 3 mmHg, the estimated right ventricular systolic pressure is 21.0 mmHg.

## 2022-01-04 ENCOUNTER — Other Ambulatory Visit: Payer: Self-pay

## 2022-01-04 NOTE — Patient Outreach (Signed)
[facility]) Care Management  01/04/2022  Haig Gerardo Sherwood [birth year] [id number]   First telephone outreach attempt to obtain mRS. No answer. Left message for returned call.  [name] [facility] Management Assistant [phone]

## 2022-01-06 ENCOUNTER — Other Ambulatory Visit: Payer: Self-pay

## 2022-01-06 NOTE — Patient Outreach (Signed)
[facility]) Care Management  01/06/2022  Eura Radabaugh Jewell December 04, 1960 [id number]   Second telephone outreach attempt to obtain mRS. No answer. Left message for returned call.  [name] [facility] Management Assistant [phone]

## 2022-01-08 ENCOUNTER — Other Ambulatory Visit: Payer: Self-pay

## 2022-01-08 NOTE — Patient Outreach (Signed)
[facility]) Care Management  01/08/2022  Allen Cooke February 02, 1961 [id number]   3 outreach attempts were completed to obtain mRs. mRs could not be obtained because patient never returned my calls. mRs=7    [name] Care Management Assistant [phone]

## 2022-07-09 ENCOUNTER — Emergency Department
Admission: EM | Admit: 2022-07-09 | Discharge: 2022-07-13 | Disposition: A | Discharge status: Home / Self Care | Payer: MEDICAID | Attending: Emergency Medicine | Admitting: Emergency Medicine

## 2022-07-09 ENCOUNTER — Encounter: Payer: Self-pay | Admitting: Student in an Organized Health Care Education/Training Program

## 2022-07-09 ENCOUNTER — Emergency Department (EMERGENCY_DEPARTMENT_HOSPITAL): Payer: MEDICAID

## 2022-07-09 DIAGNOSIS — R002 Palpitations: Secondary | ICD-10-CM | POA: Insufficient documentation

## 2022-07-09 DIAGNOSIS — F1010 Alcohol abuse, uncomplicated: Secondary | ICD-10-CM | POA: Insufficient documentation

## 2022-07-09 DIAGNOSIS — Z28310 Unvaccinated for covid-19: Secondary | ICD-10-CM | POA: Insufficient documentation

## 2022-07-09 DIAGNOSIS — I10 Essential (primary) hypertension: Secondary | ICD-10-CM | POA: Insufficient documentation

## 2022-07-09 DIAGNOSIS — Z1152 Encounter for screening for COVID-19: Secondary | ICD-10-CM | POA: Insufficient documentation

## 2022-07-09 DIAGNOSIS — F4321 Adjustment disorder with depressed mood: Secondary | ICD-10-CM

## 2022-07-09 DIAGNOSIS — R9431 Abnormal electrocardiogram [ECG] [EKG]: Secondary | ICD-10-CM

## 2022-07-09 DIAGNOSIS — F1510 Other stimulant abuse, uncomplicated: Secondary | ICD-10-CM | POA: Insufficient documentation

## 2022-07-09 DIAGNOSIS — R079 Chest pain, unspecified: Secondary | ICD-10-CM | POA: Insufficient documentation

## 2022-07-09 DIAGNOSIS — R45851 Suicidal ideations: Secondary | ICD-10-CM | POA: Insufficient documentation

## 2022-07-09 DIAGNOSIS — F1090 Alcohol use, unspecified, uncomplicated: Secondary | ICD-10-CM

## 2022-07-09 DIAGNOSIS — Z5900 Homelessness unspecified: Secondary | ICD-10-CM | POA: Insufficient documentation

## 2022-07-09 DIAGNOSIS — F1590 Other stimulant use, unspecified, uncomplicated: Secondary | ICD-10-CM

## 2022-07-09 DIAGNOSIS — R0789 Other chest pain: Secondary | ICD-10-CM

## 2022-07-09 LAB — CBC WITH DIFFERENTIAL
Basophils % Auto: 1.1 %
Basophils Abs Auto: 0 10*3/uL (ref 0.0–0.2)
Eosinophils % Auto: 3.2 %
Eosinophils Abs Auto: 0.1 10*3/uL (ref 0.0–0.5)
Hematocrit: 44.6 % (ref 41.0–53.0)
Hemoglobin: 14.8 g/dL (ref 13.5–17.5)
Lymphocytes % Auto: 37.6 %
Lymphocytes Abs Auto: 1.6 10*3/uL (ref 1.0–4.8)
MCH: 30.4 pg (ref 27.0–33.0)
MCHC: 33.2 % (ref 32.0–36.0)
MCV: 91.6 fL (ref 80.0–100.0)
MPV: 8.5 fL (ref 6.8–10.0)
Monocytes % Auto: 6.4 %
Monocytes Abs Auto: 0.3 10*3/uL (ref 0.1–0.8)
Neutrophils % Auto: 51.7 %
Neutrophils Abs Auto: 2.2 10*3/uL (ref 1.8–7.7)
Platelet Count: 208 10*3/uL (ref 130–400)
RDW: 14.8 % — ABNORMAL HIGH (ref 0.0–14.7)
Red Blood Cell Count: 4.87 10*6/uL (ref 4.50–5.90)
White Blood Cell Count: 4.3 10*3/uL — ABNORMAL LOW (ref 4.5–11.0)

## 2022-07-09 LAB — BASIC METABOLIC PANEL
Anion Gap: 9 mmol/L (ref 7–15)
Calcium: 9.3 mg/dL (ref 8.6–10.0)
Carbon Dioxide Total: 27 mmol/L (ref 22–29)
Chloride: 104 mmol/L (ref 98–107)
Creatinine Serum: 1.03 mg/dL (ref 0.51–1.17)
E-GFR Creatinine (Male): 82 mL/min/{1.73_m2}
Glucose: 110 mg/dL — ABNORMAL HIGH (ref 74–109)
Potassium: 4.1 mmol/L (ref 3.4–5.1)
Sodium: 140 mmol/L (ref 136–145)
Urea Nitrogen, Blood (BUN): 14 mg/dL (ref 6–20)

## 2022-07-09 LAB — ED HCV SCREEN WITH REFLEX: Hepatitis C Ab Screen: NONREACTIVE

## 2022-07-09 LAB — UR DRUGS OF ABUSE SCREEN
Amphetamine Screen, Urine: POSITIVE — AB
Barbiturates Screen, Urine: NEGATIVE
Benzodiazepines Screen, Urine: NEGATIVE
Cocaine Metabolite Scrn, Urine: NEGATIVE
Fentanyl Screen, Urine: NEGATIVE
Opiates Screen, Urine: NEGATIVE

## 2022-07-09 LAB — TROPONIN T
TROPONIN T: 12 ng/L (ref <=19)
TROPONIN T: 11 ng/L (ref <=19)

## 2022-07-09 LAB — POC SARS-COV-2 + FLU A/B
POC Influenza A: NOT DETECTED
POC Influenza B: NOT DETECTED
POC SARS-COV-2: NOT DETECTED

## 2022-07-09 LAB — MAGNESIUM (MG): Magnesium (Mg): 2 mg/dL (ref 1.6–2.4)

## 2022-07-09 LAB — ETHANOL, PLASMA: Ethanol, Plasma: <10 mg/dL (ref <10)

## 2022-07-09 LAB — HIV AG/AB COMBO SCREEN: HIV Ag/Ab Combo Screen: NONREACTIVE

## 2022-07-09 MED ORDER — GABAPENTIN 300 MG CAPSULE
300.0000 mg | ORAL_CAPSULE | Freq: Two times a day (BID) | ORAL | Status: AC
Start: 2022-07-10 — End: 2022-07-10
  Administered 2022-07-10 (×2): 300 mg via ORAL
  Filled 2022-07-09 (×2): qty 1

## 2022-07-09 MED ORDER — GABAPENTIN 300 MG CAPSULE
300.0000 mg | ORAL_CAPSULE | Freq: Every day | ORAL | Status: DC
Start: 2022-07-12 — End: 2022-07-10

## 2022-07-09 MED ORDER — GABAPENTIN 300 MG CAPSULE
300.0000 mg | ORAL_CAPSULE | Freq: Three times a day (TID) | ORAL | Status: DC
Start: 2022-07-09 — End: 2022-07-09

## 2022-07-09 MED ORDER — GABAPENTIN 300 MG CAPSULE
ORAL_CAPSULE | ORAL | 0 refills | Status: DC
Start: 2022-07-10 — End: 2022-07-09

## 2022-07-09 MED ORDER — GABAPENTIN 300 MG CAPSULE
300.0000 mg | ORAL_CAPSULE | Freq: Once | ORAL | Status: AC
Start: 2022-07-09 — End: 2022-07-09
  Administered 2022-07-09: 300 mg via ORAL
  Filled 2022-07-09: qty 1

## 2022-07-09 MED ORDER — GABAPENTIN 300 MG CAPSULE
ORAL_CAPSULE | ORAL | 1 refills | Status: AC
Start: 2022-07-10 — End: 2022-08-08

## 2022-07-09 MED ORDER — GABAPENTIN 300 MG CAPSULE
300.0000 mg | ORAL_CAPSULE | Freq: Three times a day (TID) | ORAL | Status: AC
Start: 2022-07-11 — End: 2022-07-11
  Administered 2022-07-11 (×3): 300 mg via ORAL
  Filled 2022-07-09 (×3): qty 1

## 2022-07-09 MED ORDER — GABAPENTIN 300 MG CAPSULE
300.0000 mg | ORAL_CAPSULE | Freq: Three times a day (TID) | ORAL | Status: DC
Start: 2022-07-12 — End: 2022-07-10

## 2022-07-09 NOTE — ED Rapid Medical Evaluation (Signed)
Emergency Department Rapid Medical Evaluation Note  Chief Complaint   Patient presents with    Palpitations       Trevor Mckee is a 62yrold male who presents to the ED with feeling like irregular heartbeat at times, esp when lying down. Using etoh daily as well, drank less than usual today. HTN in triage.     Plan: lab(s) and/or imaging and complete ED evaluation by an ED Treatment Team.             Electronically signed by: JWeber Cooks MD     This patient was seen as part of a rapid medical evaluation. Please see the ED Provider Note for full details of this patient's care. This note is not intended to be a comprehensive document of the patient's ED visit and does not represent documentation of a medical screening exam.    This patient was instructed that this preliminary assessment and any studies obtained do not constitute the patient's full workup.

## 2022-07-09 NOTE — ED Provider Notes (Addendum)
EMERGENCY DEPARTMENT PHYSICIAN NOTE - Trevor Mckee      Date of Service: 07/09/2022  4:36 PM Patient's PCP: No primary care provider on file.   Note Started: 07/09/2022 20:06 DOB: 1961/02/05      Chief Complaint   Patient presents with    Palpitations       The history provided by the patient.    Trevor Mckee is a 62yrold male, who has an unknown past medical history, presenting to the ED with palpitations.    Patient states that he has a rapid heart rate for a few hours at a time while lying down at night. Also occasionally at other times has a muscle-type pain in his chest that occurs for a few seconds before resolving completely while lying down; not associated with exertion. No shortness of breath or abdominal pain, regular bms last one today. Also notes random episodes of vertigo, nausea, and blurred vision, which last for a few minutes and then spontaneously resolve.  States that he has hx of stroke 08/2021, not currently taking medications    Does drink about 1 pint of vodka daily, uses amphetamines occasionally (last use 1 week ago). Currently homeless.       HISTORY:  No past medical history on file. There are no problems to display for this patient.     History reviewed. No pertinent surgical history.   Current Outpatient Medications:     [START ON 07/10/2022] Gabapentin (NEURONTIN) 300 mg Capsule, Please take 300 mg twice daily for one day (2/10). Then take 300 mg three times daily for one day (2/11). Then take 300 mg at breakfast, 300 mg at lunch, and 600 mg at dinner thereafter (2/12 onwards)      Social History     Social History Narrative    Not on file    No family history on file.     TRIAGE VITAL SIGNS:  Temp: 36.9 C (98.4 F) (07/09/22 1625)  Temp src: Oral (07/09/22 1625)  Pulse: 71 (07/09/22 1625)  BP: (!) 195/116 (07/09/22 1625)  Resp: 20 (07/09/22 1625)   SpO2: 98 % (07/09/22 1625)  Weight: 105 kg (231 lb 7.7 oz) (07/09/22 1625)    Physical Exam  Vitals and nursing note reviewed.    Constitutional:       General: He is not in acute distress.     Appearance: He is not ill-appearing.   HENT:      Head: Normocephalic and atraumatic.      Right Ear: External ear normal.      Left Ear: External ear normal.   Eyes:      General: No scleral icterus.        Right eye: No discharge.         Left eye: No discharge.      Extraocular Movements: Extraocular movements intact.      Conjunctiva/sclera: Conjunctivae normal.   Cardiovascular:      Rate and Rhythm: Normal rate and regular rhythm.      Pulses: Normal pulses.      Heart sounds: No murmur heard.     No friction rub. No gallop.   Pulmonary:      Effort: Pulmonary effort is normal. No respiratory distress.      Breath sounds: Normal breath sounds. No wheezing, rhonchi or rales.   Abdominal:      General: There is no distension.      Palpations: Abdomen is soft.      Tenderness: There  is no abdominal tenderness. There is no guarding.   Musculoskeletal:         General: No swelling or deformity. Normal range of motion.   Skin:     General: Skin is warm and dry.      Coloration: Skin is not jaundiced.      Findings: No erythema or rash.   Neurological:      General: No focal deficit present.      Mental Status: He is alert and oriented to person, place, and time.      Cranial Nerves: No cranial nerve deficit.      Sensory: No sensory deficit.      Motor: No weakness.      Coordination: Coordination normal.   Psychiatric:         Mood and Affect: Mood normal.         Behavior: Behavior normal.      Comments: Not endorsing SI on my evaluation, but endorsing SI with plan to [place]  Differential includes, but is not limited to: PACs, PVCs, AVNRT, afib/aflutter, vtach, methamphetamine use, alcohol use disorder, PE, acs, afib, etoh wd     Pertinent lab results:  Labs Reviewed   CBC WITH DIFFERENTIAL - Abnormal       Result Value    White Blood Cell Count 4.3 (*)     Red Blood Cell Count 4.87      Hemoglobin 14.8      Hematocrit  44.6      MCV 91.6      MCH 30.4      MCHC 33.2      RDW 14.8 (*)     MPV 8.5      Platelet Count 208      Neutrophils % Auto 51.7      Lymphocytes % Auto 37.6      Monocytes % Auto 6.4      Eosinophils % Auto 3.2      Basophils % Auto 1.1      Neutrophils Abs Auto 2.2      Lymphocytes Abs Auto 1.6      Monocytes Abs Auto 0.3      Eosinophils Abs Auto 0.1      Basophils Abs Auto 0.0     BASIC METABOLIC PANEL - Abnormal    Sodium 140      Potassium 4.1      Chloride 104      Carbon Dioxide Total 27      Anion Gap 9      Urea Nitrogen, Blood (BUN) 14      Creatinine Serum 1.03      Glucose 110 (*)     Calcium 9.3      E-GFR Creatinine (Male) 71     UR DRUGS OF ABUSE SCREEN - Abnormal    Barbiturates Screen, Urine NEGATIVE      Benzodiazepines Screen, Urine NEGATIVE      Cocaine Metabolite Scrn, Urine NEGATIVE      Opiates Screen, Urine NEGATIVE      Amphetamine Screen, Urine PRESUMPTIVE POSITIVE (*)     Fentanyl Screen, Urine NEGATIVE     TROPONIN T - Normal    TROPONIN T 12      Narrative:     [employer] uses a GEN 5 STAT cardiac Troponin T assay that demonstrates < 10% CV (imprecision) at the 99th percentile upper reference  limit (URL).  The 99th percentile URL for this assay is 19 ng/L.    High sensitivity cardiac troponin results should never be used with other contemporary troponin assays, and Troponin I should never be used interchangeably with Troponin T.   ED HCV SCREEN WITH REFLEX - Normal    Hepatitis C Ab Screen Nonreactive      Narrative:     No antibodies to HCV detected; a nonreactive result does not exclude the possibility of exposure to HCV.   MAGNESIUM (MG) - Normal    Magnesium (Mg) 2.0     ETHANOL, PLASMA - Normal    Ethanol, Plasma <10     TROPONIN T - Normal    TROPONIN T 11      Narrative:     [employer] uses a GEN 5 STAT cardiac Troponin T assay that demonstrates < 10% CV (imprecision) at the 99th percentile upper reference limit (URL).  The 99th percentile URL for this assay is 19  ng/L.    High sensitivity cardiac troponin results should never be used with other contemporary troponin assays, and Troponin I should never be used interchangeably with Troponin T.   HIV AG/AB COMBO SCREEN - Normal    HIV Ag/Ab Combo Screen Nonreactive      Narrative:     This test was performed using the 4th Generation Abbott Architect i1000 Chemiluminescent assay.     POC SARS-COV-2 + FLU A/B    POC Influenza A Not Detected      POC Influenza B Not Detected      POC SARS-COV-2 Not Detected           Pertinent imaging results (interpreted independently by me):   Chest X-Ray: unremarkable    Radiology reads reviewed:   I reviewed the initial radiology imaging reads that were available prior to discharge or transfer of care.   DX CHEST 2 VIEWS    Result Date: 07/09/2022  PROCEDURE INFORMATION: Exam: XR Chest Exam date and time: 07/09/2022 4:57 PM Age: 62 years old Clinical indication: , Symptoms: Chest pain TECHNIQUE: Imaging protocol: Radiologic exam of the chest. Views: 2 views. COMPARISON: No relevant prior studies available. FINDINGS: Lungs: The lungs are well-expanded and clear. Pleural spaces: The costophrenic sulci are sharp. Heart/Mediastinum: The cardiomediastinal silhouette is within normal limits. Bones/joints: No acute bony abnormality is seen.     IMPRESSION: No acute cardiopulmonary disease. THIS DOCUMENT HAS BEEN ELECTRONICALLY SIGNED BY MAJID MAJIDIAN, MD     ECG (interpreted independently by me): NSR, no ST elevations/depressions        PATIENT SUMMARY:   Trevor Mckee is a 62yrold male with hx CVA (08/2021 per patient) and HTN currently not on medications who presents with palpitations. Palpitations most likely 2/2 heavy alcohol use, at least in part, as patient drinks 1 pint of vodka daily, which is likely precipitating "holiday heart." Also uses amphetamines about once weekly with +Utox, which could contribute to hyperadrenergic state. EKG with NSR, no e/o afib or PVCs or ST changes, troponin  negative at 11 with no e/o ischemic disease.    As such, patient's palpitations could be PACs or episodes of SVT iso alcohol use, would benefit from outpatient Holter monitor to assess for this. However, needs a PCP to follow up on the Holter results, so TOC consult placed to coordinate PCP follow-up where Holter monitor can be prescribed. Neuro exam unremarkable, no e/o stroke symptoms. SUN and SW also consulted given alcohol use and homelessness.  Initially planned to discharge patient on gabapentin; however, patient endorsed active SI with plan to social worker, warranting PES evaluation. Placed on voluntary 5150 hold by SW. Started gabapentin in ED as a result (300 mg qhs 2/9, then 300 BID 2/10, then 300 TID 2/11, then 300 breakfast-300 lunch-600 dinner).             ED Medication Administration through 07/09/2022 2243       Date/Time Order Dose Route Action    07/09/2022 2229 PST Gabapentin (NEURONTIN) Capsule 300 mg 300 mg ORAL Given            Case discussed with the follow service(s):  SUBSTANCE USE NAVIGATOR (SUN)  SOCIAL SERVICES/ SOCIAL WORKER CONSULT-ED CRISIS  ED HEALTH NAVIGATOR CONSULT  SOCIAL SERVICES/ SOCIAL WORKER CONSULT-ED CRISIS  SOCIAL SERVICES/ SOCIAL WORKER CONSULT-ED CRISIS  PSYCHIATRY CONSULT      Brief details of discussion(s): Discussed with consult service.  Recommendations pending.      Disposition: 5150 Evaluation - Medically Clear for Psychiatric Evaluation and Disposition    The patient requires psychiatric evaluation for admission for behavioral health decompensation. The patient has a psychiatric illness with potential threat to self or others.    Cleared for psychiatric evaluation using labs.    Medications Prescribed this Visit            Gabapentin (NEURONTIN) 300 mg Capsule (Starting on 07/10/2022)            Clinical Impression:  Alcohol use disorder  Methamphetamine use disorder  Homelessness  Palpitations      LAST VITAL SIGNS  Temp: 36.7 C (98.1 F) (07/09/22 2015)  Temp  src: 1  Pulse: 69 (07/09/22 2015)  Blood Pressure: 160/100 (07/09/22 2015)  Resp Rate: 16 (07/09/22 2015)  SpO2: 97 (07/09/22 2015)  Weight: 105 (231 lb 7.7 oz) (07/09/22 1625)    PATIENT'S GENERAL CONDITION:  Fair: Indicators are favorable.    Electronically signed by: Baruch Merl, MD, Resident          A medical screening exam was performed.      I, Lucita Lora, MD saw and evaluated this patient.  I developed the care plan with the resident and agree with the findings and plan as outlined in our combined note. Lucita Lora, MD     I personally interpreted the imaging/tracings and agree with the ED interpretations documented above.    Electronically signed by: Lucita Lora, MD, Attending Physician    Reviewed.

## 2022-07-09 NOTE — ED Triage Note (Addendum)
Pt with intermittent chest pain, palpitations and left arm radiation with no provocation times 2 weeks. Pt states his "heart is stopping and starting." Pt denies SOB. Pt denies medical hx. Pt with HTN. Pt with ETOH. Pt feels like he might be having withdraws. Pt endorses dizziness. Pt with nausea. Pt had stroke in 2023, no deficits.

## 2022-07-09 NOTE — ED Triage Note (Signed)
No Answer" note: The patient was called using the ED PA system. The patient was not found after a search of the ED Waiting Room Areas.

## 2022-07-09 NOTE — Discharge Instructions (Addendum)
Thank you for visiting the [facility]. You were seen for palpitations. This is likely at least partly due to your alcohol use, as alcohol can cause palpitations. We have prescribed you  medication to help with stopping alcohol. This is called gabapentin  - please take gabapentin 300 mg twice daily starting tomorrow (2/10)  - then take gabapentin 300 mg three times daily starting 2/11  - then take gabapentin 300 mg at breakfast, 300 at lunch, and 600 (2 tabs) at dinner. Continue this indefinitely until you follow up with your primary care doctor.    Please ask your primary care doctor to order a holter monitor for you to monitor your heart rhythm. This can detect whether you have

## 2022-07-09 NOTE — Social Work (Signed)
Pt's referral packet was faxed to [facility], [place], [place], [place], and [facility] for placement.

## 2022-07-09 NOTE — Suicide Risk (Signed)
SAFE-T Pocket Card  Suicide Assessment Five-step Evaluation and Triage      Risk Factors:  Suicidal Behavior: ideation w/ [place]  Current/Past Psychiatric Disorders: n/a  Key Symptoms: depressed mood, hopelessness, suicidal ideation w/ plan  Family History: none reported  Precipitants/Stressors/Interpersonal: recent medical issues  Change in Treatment: n/a  Access to Firearms: none reported    Protective Factors:  Internal: none reported  External: none reported    Suicide Inquiry:  Ideation: yes  Plan: jump off a bridge [place] the river  Behaviors: n/a  Intent: yes    Risk Level/Intervention: based on clinical judgment    Document: see full clinical assessment      SUICIDE RISK SCORE: Moderate      Electronically signed by:   Marney Setting, LCSW  Pager#

## 2022-07-09 NOTE — Social Work (Addendum)
CLINICAL SOCIAL SERVICES - CRISIS TEAM  PSYCHIATRIC EMERGENCY SERVICES DIAGNOSTIC INTERVIEW    Note Date & Time:  07/09/2022 22:46 Admission Date:  07/09/2022  4:36 PM   Date of Birth:  September 24, 1960 Age:  62yr  Date of Service:  07/09/2022 Referred By:  Medical Team   Patient Name:  DJenifer MccandlishMRN:  [id number]  Gender Identity:   Ethnic Background:     Pronouns:   Ethnic Group:  Not Hispanic or Latino       Reason for Referral:  Psychiatric Evaluation   Language:  EMaterials engineerAssisting with Interview:  No  Persons Interviewed:  Pt  Next of Kin: none provided  Next of Kin Phone Number:  n/a  Other contacts[place]  [place]   Legal status at beginning of evaluation: no legal status    Legal status at end of evaluation: Voluntary status    HISTORY OF PRESENTING PROBLEM / ILLNESS  Per triage RN pt presented to the ED w/ CC of "intermittent chest pain, palpitations and left arm radiation with no provocation times 2 weeks. Pt states his "heart is stopping and starting." Pt denies SOB. Pt denies medical hx. Pt with HTN. Pt with ETOH. Pt feels like he might be having withdraws. Pt endorses dizziness. Pt with nausea. Pt had stroke in 2023, no deficits."    Pt was evaluated, medically cleared for d/c and referred for homeless d/c. Upon this wProbation officermeeting w/ pt he was observed to be laying in bed, tearful, staring at the wall. Pt reported his d/c plan is [place] the [place]. Pt stated he is "ready to take myself out" due to being homeless for 7 years w/ worsening health, chronic pain due to needing a knee replacement which impairs his ability to ambulate and meet his basic needs (says "I can't even get myself water, I just sit there crying"), and new onset heart palpitations. Pt reports for the past week he has been increasingly depressed, hopeless, and having thoughts of suicide w/ plan. He expressed some ambivalence about intent, reporting he wants help but feels he has no  other options at this time.    He was unable to identify any supportive persons in his life, does not receive homeless services or mental health services anywhere at present, [place] He denies ever feeling like this previously, and denies hx of suicide attempts. Denies HI/AVH. Denies any self injurious bx. No hx available in EMR, no hx in [facility]/Smart[facility], pt denies previous psychiatric dx or tx.    Writer offered BACS/ACCESS referral and homeless resources but pt is unable to engage in safety planning at this time. He is requesting assistance to stabilize his mental health.    PAST PSYCHIATRIC HISTORY  AVATAR n/a  Linkages:  n/a  Hospitalizations:  n/a  Diagnosis:  n/a  Psychotropic Medications:  n/a  Past Suicide Attempts:  n/a  Family History of mental illness:  n/a    PAST MEDICAL HISTORY   See Medical Report    HISTORY OF SUBSTANCE USE:   Alcohol Use Current  Methamphetamines Use Current    PSYCHOSOCIAL HISTORY  Marital Status:  Single  Patients education/occupation:  unemployed  Living Situation:  homeless  Patient resides with:  self  Patient's CSport and exercise psychologistInfo:   [place] [id number] Phone:  [phone](home)    Family involved:  no  Family support:  no  Patient's Psychologist, sport and exercise (e.g. Self, Conservator, payee, parent, etc.):  self  On Probation / Parole:  no  Any safety issues at home or in a relationship:  n/a  Acupuncturist:   n/a  Insurance:  Yes    MENTAL STATUS EXAM  Alert:  yes  Oriented:  Person, Place, Date, and Time  Appearance:  disheveled  Memory:  Long-term Intact:  yes  and Short-term Intact yes  Behavior:  lethargic  Attitude:  cooperative  Eye Contact:  fair  Affect:  tearful and sad  Mood:  depressed and pessimistic  Speech:  appropriate  Ability to Communicate:  good  Concentration:  good  Comprehension:  good  Insight:  impaired  Judgment:  impaired  Impulse Control:  fair  Intellectual Functioning:   normal  Hallucinations: No  Delusions:  None  Thought Process:  intact  Thought Content:  Helplessness, Hopelessness, and Suicidal thoughts  Current Suicidal Ideation:  yes Plan:  jump [place]   Current Homicidal Ideation:  no       BROSET VIOLENCE CHECKLIST  Confused:  No = 0  Irritability:  No = 0   Boisterous:  No = 0   Verbal Threat:  No = 0   Physical Attacks:  No = 0   Attacks on Objects:  No = 0   Final Score:  0 = Risk of Violence is Low, Patient is Safe to Return to Waiting Room     The following risk factors were identified:   Social isolation, Financial problems, Impulsive or aggressive tendencies, Serious illness, and Substance use disorder    The following protective factors were identified:   Cultural, religious, personal beliefs that discourage suicide     ASSESSMENT   Pt is a 62 y/o male w/ no prior MH hx presenting to the ED for heart palpitations and endorsing suicidal ideation w/ plan. He was observed to be disheveled, malodorous, w/ intermittent eye contact, depressed mood and congruent affect, tearfulness, and endorses hopelessness and increasing suicidal ideation triggered by worsening health and inability to care for himself [place] encamp[place]. Pt has no prior mental health dx or tx and denies hx of SI, attempt, or self injurious bx. Currently has a plan to jump [place] bridge [place] the river after d/c from hospital but is ambivalent about intent to act d/t desire for help. Pt's risk factors include age, chronic pain, new onset medical condition, homelessness, hx of alcohol and methamphetamine abuse, and lack of social support. He is not connected to homeless or mental health resources in the community and denies having family or social support. Unable to safety plan for d/c.     Pt reports desire to receive help for his depression and SI and is requesting MH placement. Pt was placed on a voluntary admission for psych tx.    Patient meets criteria for 51/50:  Voluntary admission  Willing to agree to plan for patient's safety:  no  Adequate support system:  no  [place] Issue:  no   Access to guns:  none reported    MANDATED REPORTING   CPS/APS Referral:  No abuse suspected for this encounter    DSM 5 DIAGNOSIS  F43.21       Adjustment disorder, With depressed mood    PLAN/DISPOSITION  Consulted with medical team:  Yes  Disposition: Voluntary admission and arrange for hospitalization/placement  Referrals Provided:  PES Psych Eval Resource &  Referral List: None  Counseling Provided by [place] SW:  Managing depression / anxiety / stress and homeless resources    Signature:  Marney Setting, LCSW        Licensed Clinical Social Worker

## 2022-07-09 NOTE — Social Work (Signed)
ED CRISIS TEAM -  PES DISPOSITION NOTE    Patient was evaluated in the ED by member of the [facility] Crisis Team.      Patient interviewed, chart reviewed, collateral obtained.     Patient meets criteria for 51/50: Other Voluntary admission  Patient agrees to be placed on voluntary admission: Yes  Disposition: Voluntary admission and arrange for transportation and placement  Diagnosis: F43.21       Adjustment disorder, With depressed mood    RN, MD notified. Full evaluation to follow.    Electronically Signed by:  Marney Setting, LCSW  Licensed Clinical Social Worker   Crisis Team - Emergency Department

## 2022-07-10 DIAGNOSIS — Z8673 Personal history of transient ischemic attack (TIA), and cerebral infarction without residual deficits: Secondary | ICD-10-CM

## 2022-07-10 DIAGNOSIS — I10 Essential (primary) hypertension: Secondary | ICD-10-CM

## 2022-07-10 MED ORDER — THIAMINE HCL (VITAMIN B1) 100 MG TABLET
100.0000 mg | ORAL_TABLET | Freq: Every day | ORAL | Status: DC
Start: 2022-07-10 — End: 2022-07-12
  Administered 2022-07-10 – 2022-07-12 (×3): 100 mg via ORAL
  Filled 2022-07-10 (×3): qty 1

## 2022-07-10 MED ORDER — GABAPENTIN 300 MG CAPSULE
300.0000 mg | ORAL_CAPSULE | Freq: Two times a day (BID) | ORAL | Status: DC
Start: 2022-07-12 — End: 2022-07-13
  Administered 2022-07-12 – 2022-07-13 (×3): 300 mg via ORAL
  Filled 2022-07-10 (×3): qty 1

## 2022-07-10 MED ORDER — FOLIC ACID 1 MG TABLET
1.0000 mg | ORAL_TABLET | Freq: Every day | ORAL | Status: DC
Start: 2022-07-10 — End: 2022-07-12
  Administered 2022-07-10 – 2022-07-12 (×3): 1 mg via ORAL
  Filled 2022-07-10 (×3): qty 1

## 2022-07-10 MED ORDER — MULTIVITAMIN WITH FOLIC ACID 400 MCG TABLET
1.0000 | ORAL_TABLET | Freq: Every day | ORAL | Status: DC
Start: 2022-07-10 — End: 2022-07-12
  Administered 2022-07-10 – 2022-07-12 (×3): 1 via ORAL
  Filled 2022-07-10 (×3): qty 1

## 2022-07-10 MED ORDER — LOSARTAN 25 MG TABLET
25.0000 mg | ORAL_TABLET | Freq: Every day | ORAL | Status: DC
Start: 2022-07-10 — End: 2022-07-13
  Administered 2022-07-10 – 2022-07-13 (×4): 25 mg via ORAL
  Filled 2022-07-10 (×4): qty 1

## 2022-07-10 MED ORDER — ACETAMINOPHEN 500 MG TABLET
1000.0000 mg | ORAL_TABLET | Freq: Once | ORAL | Status: AC
Start: 2022-07-10 — End: 2022-07-10
  Administered 2022-07-10: 1000 mg via ORAL
  Filled 2022-07-10: qty 2

## 2022-07-10 MED ORDER — GABAPENTIN 300 MG CAPSULE
600.0000 mg | ORAL_CAPSULE | Freq: Every day | ORAL | Status: DC
Start: 2022-07-12 — End: 2022-07-13

## 2022-07-10 MED ORDER — DIAZEPAM 5 MG TABLET
5.0000 mg | ORAL_TABLET | ORAL | Status: AC | PRN
Start: 2022-07-10 — End: 2022-07-12
  Administered 2022-07-10 – 2022-07-11 (×2): 10 mg via ORAL
  Filled 2022-07-10 (×2): qty 2

## 2022-07-10 NOTE — Nurse Focus (Signed)
Assumed care for 30 min break relief.

## 2022-07-10 NOTE — Consults (Signed)
Patient is currently on a hold therefore out of HN scope. Please re-consult at time of dc if services are still needed.    Electronically signed by:  Alinda Deem  [place] or  [place] Text

## 2022-07-10 NOTE — Consults (Signed)
PSYCHIATRY CONSULT INITIAL ASSESSMENT      Date: 07/10/2022    Reason for Consultation:  SI  Requesting Service: Emergency Medicine   Name of Requesting Attending: Dimitri Ped,*     History of Present Illness   Trevor Mckee is a 62yrold male with hx of CVA, HTN, alcohol and amphetamine abuse, homelessness who  initially presented to ED for heart palpitations, later endorsed SI with plan to [place] the [place].     Patient feels like he is "at the end of his rope." Has been unhoused for the past 8 years,  [place] [facility] Has been attempting to obtain housing in income for last several months to years without success.  Was living with his sister but can not return to her home, denies having any close social support system.  Feels exhausted, has some hope for the future if he was able to obtain housing.  Has been increasing his drinking due to psychosocial stressors, last drink yesterday a.m..  History withdrawal tremors, denies seizures.  States that he does not want to die necessarily, but feels like if he was left out on the street without any additional support or resources he might as well die.  Is motivated to get reconnected to the health care system, get into housing, figure out retirement income.  Was recently approved for section 8.             Psychiatric Review of Systems     Depression: endorses decrease in interest, motivation, difficulty with sleep, energy, concentration,or suicidal ideation    Anxiety:  Denies any specific phobia, panic attacks, excessive worry, or somatic symptoms of anxiety.    OCD:  denies any intrusive thoughts, obsessions, or compulsions. Denies any repetitive tasks that much be done in ritualistic fashion.    Psychosis:  Denies any AVH, paranoia, thought projection, thought insertion, or ideas of reference.     Mania:  Denies and current or hx of excess happiness, increased energy, decreased need or minimal sleep, grandiosity,  or impulsivity with regards to spending or sexual promiscuity.     Trauma:  Denies any history of physical, sexual or emotional trauma    Other:  Denies any headaches, nausea, vomiting, dizziness.    History     PAST PSYCHIATRIC HISTORY (per report and review of records):History  Past Psychiatric Symptoms/Diagnoses: alcohol use disorder, amphetamine use disorder  Past Psychiatric Hospitalizations: no prior  Past Suicide Attempts/Violence: denies  Current Psychiatric Medications: denies   Past Medication Trials: celexa  Outpatient Linkage: none    SUBSTANCE USE HISTORY Social Determinants of Health Results Review  Tobacco: denies  Alcohol: 1 pint to one fifth of liquor daily  Opiates: denies  Stimulants: Using on/off for past few years. Last use per patient one week ago.  utox + amphetamines     SOCIAL AND FAMILY HISTORY:     Relevant Social Determinants of Health: Housing Insecurity , FGeographical information systems officer, Job Insecurity , FLandscape architect, Access to Healthcare, aTraining and development officerin tStarbucks Corporation   Family History:  Mother: depression, prior suicide attempt.     I personally reviewed Allergies, relevant history and current home meds        MEDICAL HISTORY  There are no problems to display for this patient.   No Known Allergies   No past medical history on file.  No past surgical history on file. (Not in a hospital admission)  There is no immunization history on file for this patient.      Objective     Medications MAR  Gabapentin (NEURONTIN) Capsule 300 mg, ORAL, BID   Followed by  Derrill Memo ON 07/11/2022] Gabapentin (NEURONTIN) Capsule 300 mg, ORAL, TID  [START ON 07/12/2022] Gabapentin (NEURONTIN) Capsule 300 mg, ORAL, BID  [START ON 07/12/2022] Gabapentin (NEURONTIN) Capsule 600 mg, ORAL, Daily Bedtime           Vitals:   Temp src: Oral (02/10 0134)  Temp:  [36.6 C (97.9 F)-36.9 C (98.4 F)]   Pulse:  [63-84]   BP: (152-195)/(84-116)   Resp:  [16-20]   SpO2:  [96 %-98 %]     LAB TESTS/STUDIES  Results  Review  Reviewed recent lab results, with the following pertinent findings/interpretations:  CBC: unremarkable   BMP: wnl  UDS: + amphetamines  Serum etoh: <10  Radiology      MENTAL STATUS EXAM:  Appearance: older adult aged male, in hospital bed, wearing hospital gown  Eye Contact: fair   Cognition:  Well oriented Recalls recent events Fund of knowledge Reasonable  Behavior:  Cooperative with interview            Impulse Control: Good  Psychomotor:  no agitation or retardation  Speech:  Rate normal    Volume normal    Fluent    Coherent    Spontaneous    Mood: Moderately depressed      Affect:    Mood congruent      Thought Process:  Linear    Goal-directed    Associations intact      Thought Content:             Major Themes: wanting to find stable housing            Suicidal/Homicidal Ideation: endorses SI, no current plan or intent. Denies HI            Perceptions:  Denies AVH, does not appear to be responding to internal stimuli.  Insight:  Good      Judgement: fair          Assessment   Trevor Mckee is a 62yrold male with history of alcohol use disorder, stimulant use disorder, homelessness, who presents to the ED initially for palpitations.  Evaluated, plan to discharge on gabapentin, however patient endorsed SI with plan (to jump off of a bridge into the river)  to social work.  Patient endorses psychosocial stressors including unstable housing, financial instability, lack of social support leading to low mood and substance use.  Patient reports that he has been attempting to obtain stable housing and medical care for quite some time but not having any success.  MSE notable for to dysthymic affect, thought process, endorses SI.  Patient states that he would not be suicidal if he had access to stable housing.  Patient without prior history of psychiatric hospitalizations, suicide attempts, does not have access to firearms, is future oriented low acute risk of suicide at this time.  Patient likely not to  benefit from inpatient psychiatric hospitalization at this time.  Given specific stressor related to housing contributing to current suicidal ideation, patient would benefit from connection to resources for housing right crisis residential.  Plan to begin process of referral.  Patient last drink 2/9 a.m., history of withdrawal symptoms, plan to monitor on CIWA.   Given patient's prior history of CVA, uncontrolled hypertension, previously on Losartan, reasonable to restart at low dose.  DIAGNOSIS  Alcohol use Disorder, By History  Stimulant Use Disorder, By History        RECOMMENDATIONS  - start losartan 25 mg daily for HTN  - CIWA protocol  - Thiamine 123XX123 mg daily  - Folic Acid 1 mg daily  - crisis residential evaluation and referral    Case was discussed with attending physician Dr. Pollie Meyer. Additional recommendations may follow in an addendum.    Fabienne Bruns MD, PGY-2  Psychiatry Resident  Pager: [phone]     Pt was seen, staff and discussed with Dr. Pascal Lux. I agree with the assessment and plan as outlined above.    Dr. Pollie Meyer

## 2022-07-10 NOTE — ED Nursing Note (Signed)
Relieved primary RN for 30 min break

## 2022-07-10 NOTE — Clinical Case Management (Signed)
Clinical Case Management Documentation    Name: Trevor Mckee  MRN: [id number]   Date of Birth: 1961/03/26 (33yr Gender: male    Note Date: 07/10/2022 Note Time: 09:37     Current Isolation(s): No active isolations  Current Infection(s): No active infections      INITIAL ASSESSMENT NOTE    Patient was transferred from Networked reference to record EAF .       [place] Discharge Address: TBD    Patient can follow-up with:   PCP: No Pcp, No Pcp, MD  / Phone Number: None  [id number] [address] [place] [phone]     Funding/Billing: Payor: [place]/ Plan: PHaverford CollegeGNolic/ Product Type: *No Product type* /    Secondary Insurance: None    Reason for admission: Voluntary psych    Diagnosis: Alcohol use disorder  (primary encounter diagnosis)  Palpitations  Amphetamine use  Adjustment disorder with depressed mood  Date/Time of Admit: 07/09/2022  4:36 PM  Initial 5150 written?: No  Reason for 5150 Hold: Voluntary  Medical Clearance: Date: 07/09/2022  Time: 2253  Covid Status as of today: negative  Barriers: Acceptance and bed availability      Is an interpreter used?: No  Living arrangements: Alone  Type of Residence: homeless  Conserved?: No  Pre-Hospitalization self-care deficits: None  Pre-Hospitalization mobility: Modified Independent  Bladder function: Continent  Bowel function: Continent  Pre-Hospital Services: None  Type of home health care services in place: None  DME in place: None  Anticipated Preliminary Discharge Disposition: Inpatient psych facility  Does the patient have ongoing DC Planning needs?: TBD  [facility] Ph: [phone] SW[place]Ph: [phone] HBendPh: [phone] [facility]  Ph: [phone] Is the patient ready for  discharge today?: TBD       Comments: Per SW notes, "Pt was evaluated, medically cleared for d/c and referred for homeless d/c. Upon this wProbation officermeeting w/ pt he was observed to be laying in bed, tearful, staring at the wall. Pt reported his d/c plan is [place] encamp[place]. Pt stated he is "ready to take myself out" due to being homeless for 7 years w/ worsening health, chronic pain due to needing a knee replacement which impairs his ability to ambulate and meet his basic needs (says "I can't even get myself water, I just sit there crying"), and new onset heart palpitations. Pt reports for the past week he has been increasingly depressed, hopeless, and having thoughts of suicide w/ plan. "     Follow-up/recommendations:  CM will continue to follow for placement    Date/Time: 07/10/2022 09:37  Electronically Signed by:   SDaphane Shepherd Case Manager  Pager: [phone]

## 2022-07-10 NOTE — ED Nursing Note (Addendum)
Patient came in to unit via wheelchair accompanied by MHW. Placed comfortably in bed. Snacks provided per request. Not in acute distress.

## 2022-07-10 NOTE — Clinical Case Management (Signed)
Clinical Case Management Documentation    Name: Trevor Mckee  MRN: [id number]   Date of Birth: May 11, 1961 (43yr Gender: male    Note Date: 07/10/2022 Note Time: 14:45     Current Isolation(s): No active isolations  Current Infection(s): No active infections      DISCHARGE PLANNING NOTE    Patient was transferred from Networked reference to record EAF .       [place] Discharge Address: TBD    Patient can follow-up with:   PCP: No Pcp, No Pcp, MD  / Phone Number: None  [id number] [place] [phone]   Funding/Billing: Payor: [place]rib[place]/ Plan: [place]  Reason for admission: Voluntary psych    Living arrangements: Alone  Type of Residence: homeless  Pre-Hospitalization self-care deficits: None  Pre-Hospitalization mobility: Modified Independent  Type of home health care services in place: None  DME in place: None  Does the patient have ongoing DC Planning needs?: TBD       Initial 5150 written?: No  [facility]:: [facility] Ph: [phone] [place]Ph: [phone] HAnchor BayPh: [phone] [facility]  Ph: [phone]      Clinical Case Management  Mental Health Referral Coordination Note    Referral Type: Inpatient Psychiatric Placement    Medical Clearance Confirmed? Dr. FMarijean Bravo- 2/9    Functional Status: Indep    Covid Test: 2/9 - Neg    Active Referrals: All listed above     Declined Referrals: None    Comment: Writer called and updated to HTeaticket Team noted that [facility] Will have to wait for SW to do referral.     Current Placement Barrier(s): Out of CFirstEnergy Corp BKindred Healthcareavailability    Plan/ to be followed: Involuntary Psychiatric Placement      Signed by:   SLorelle Gibbs  Mental Health  Referral Coordinator  Department of Clinical Case Management  Phone: [phone] Cell: [phone] Active on Tigertext

## 2022-07-10 NOTE — Social Work (Signed)
Per [facility], this pt has [facility]. Therefore he will not be accepted by [facility]

## 2022-07-10 NOTE — ED Nursing Note (Signed)
Report called to Bonnita Levan, Therapist, sports. Pt in no acute distress. Vital signs stable. Awaiting transport to [place].

## 2022-07-11 DIAGNOSIS — R251 Tremor, unspecified: Secondary | ICD-10-CM

## 2022-07-11 NOTE — Best Poss Rx Hx (Signed)
Best Possible Medication History (BPMH) Note   Trevor Mckee   07/11/2022  08:53  Emergency Medicine    Sources:   Primary:   Patient    Secondary:   Other (DrFirst, CURES, CareEverywhere, Chart Review)  Additional:   Pharmacy (Name: [facility] [phone]; number: [id number])      Follow-up/Pending Medication Entries:  Further Review Needed:   None. Text paged MD (psych team) that [place] note is complete.    Considerations for Discharge  Outpatient follow up; medication adherence    Medication List:   Prior to Admission Medications   Prescriptions   Citalopram (CELEXA) 20 mg Tablet   Sig: Take 1 tablet by mouth every day.   Note (07/11/2022): Has not picked up yet but recently prescribed 05/2022   Losartan-Hydrochlorothiazide (HYZAAR) 50-12.5 mg per tablet   Sig: Take 1 tablet by mouth every morning.   Note (07/11/2022): Has not picked up yet but recently prescribed 05/2022   Tamsulosin (FLOMAX) 0.4 mg Capsule   Sig: Take 1 capsule by mouth every day. Take 30 minutes after meals at the same times each day.   Note (07/11/2022): Has not picked up yet but recently prescribed 05/2022      Facility-Administered Medications: None     Patient has above medications ready to be picked up at his pharmacy but has been unable to pick up. Citalopram has been helpful in past for depression but has not taken consistently. Takes losartan-hctz for blood pressure - had been on multiple other medications post-stroke a few years ago but patient says "my doctor removed all of them and just started me on this blood pressure medication." Says he takes tamsulosin for his prostate    Added:   Citalopram  2. Losartan-HCTZ  3. Tamsulosin    Prior Psychiatric Medications:  Name   Start Date  End Date  Max Dose  Reason for D/C  citalopram  2022   current   20 mg daily  -         CURES Report: 12 month query completed 07/11/22:  No results when queried under partial match "Linna Hoff" "Chr" DOB: September 24, 1960       This note is documentation that a Best Possible  Medication History (BPMH) has been completed using the most reliable sources at the moment. Information may be subject to change.   Above information communicated to provider for review.     Marya Amsler, RPh  [place], PharmD, [place] Pharmacist  [facility] Pharmacist Outpatient Services Hours: Mon-Thurs [phone]  Department of Pharmacy Services

## 2022-07-11 NOTE — Progress Notes (Signed)
Psychiatry Consult Progress Note  Date of Service: 07/11/2022     Summary   Trevor Mckee is a 26yrmale  hx of CVA, HTN, alcohol and amphetamine abuse, homelessness who  initially presented to ED for heart palpitations, later endorsed SI with plan to jump [place] bridge [place] the [place]    Interval   - received PRN diazepam 10 mg x1  - NAEO  - Taking scheduled medications      Subjective    Reports sleeping well. Endorses SI while thinking about possibility of returning to prior living conditions.. Denies active intent or plan. Denies HI, AVH. Endorses some tremors, feels like he is having some symptoms of withdrawal.          Objective    Medications    Folic Acid Tablet 1 mg, ORAL, QAM  Gabapentin (NEURONTIN) Capsule 300 mg, ORAL, TID  [START ON 07/12/2022] Gabapentin (NEURONTIN) Capsule 300 mg, ORAL, BID  [START ON 07/12/2022] Gabapentin (NEURONTIN) Capsule 600 mg, ORAL, Daily Bedtime  Losartan (COZAAR) Tablet 25 mg, ORAL, QAM  Multivitamin (HEXAVITAMIN) Tablet 1 tablet, ORAL, QAM  Thiamine Tablet 100 mg, ORAL, QAM      Diazepam (VALIUM) Tablet 5-10 mg, ORAL, Q2H PRN        Vitals   Blood pressure (!) 155/87, pulse 74, temperature 36.4 C (97.5 F), temperature source Oral, resp. rate 18, height 1.778 m (5' 10"$ ), weight 105 kg (231 lb 7.7 oz), SpO2 96 %.    LAB TESTS/STUDIES    Reviewed recent lab results, with the following pertinent findings/interpretations:  No new labs  MENTAL STATUS EXAM:  Appearance: older adult aged male, in hospital bed, wearing hospital gown. Fair grooming/hygiene.   Eye Contact: good  Cognition:  grossly intact  Behavior:  Cooperative with interview            Impulse Control: Good  Psychomotor:  no agitation or retardation. + b/l upper extremity tremors.   Speech:  fluent, spontaneous. Normal rate, volume, tone.   Mood: "okay"   Affect:    euthymic  Thought Process:  Linear    Goal-directed    Associations intact      Thought Content:             Major Themes: wanting to find stable  housing            Suicidal/Homicidal Ideation: endorses SI, no current plan or intent. Denies HI            Perceptions:  Denies AVH, does not appear to be responding to internal stimuli.  Insight:  Good      Judgement: fair         Assessment & Plan       Trevor Mckee a 89yrld male with history of alcohol use disorder, stimulant use disorder, homelessness, who presents to the ED initially for palpitations.  Evaluated, plan to discharge on gabapentin, however patient endorsed SI with plan (to jump off of a bridge into the river)  to social work.  Patient endorses psychosocial stressors including unstable housing, financial instability, lack of social support leading to low mood and substance use.  Patient reports that he has been attempting to obtain stable housing and medical care for quite some time but not having any success.  MSE notable for to goal directed thought process, euthymic affect, good eye contact.   Patient states that he would not be suicidal if he had access to stable housing.  Patient without prior history  of psychiatric hospitalizations, suicide attempts, does not have access to firearms, is future oriented low acute risk of suicide at this time.  Patient likely not to benefit from inpatient psychiatric hospitalization at this time.  Given specific stressor related to housing contributing to current suicidal ideation, patient would benefit from connection to resources for housing right crisis residential.  Plan to begin process of referral.  Patient last drink 2/9 a.m., history of withdrawal symptoms, plan to monitor on CIWA.      DIAGNOSIS  Alcohol Use Disorder, By History  Stimulant Use Disorder, By History      RECOMMENDATIONS  - continue losartan 25 mg daily for HTN  - CIWA protocol  - Thiamine 123XX123 mg daily  - Folic Acid 1 mg daily  - crisis residential evaluation and referral        Report electronically signed by: Trevor Bruns, MD Resident     Pt staffed with Trevor Mckee. I agree  with the assessment and plan as outlined above.

## 2022-07-11 NOTE — Clinical Case Management (Signed)
Clinical Case Management Documentation    Name: Trevor Mckee  MRN: [id number]   Date of Birth: 07/20/1960 (50yr Gender: male    Note Date: 07/11/2022 Note Time: 12:49     Current Isolation(s): No active isolations  Current Infection(s): No active infections      DISCHARGE PLANNING NOTE    Patient was transferred from Networked reference to record EAF .       [place] Address[place] Discharge Address: TBD    Patient can follow-up with:   PCP: No Pcp, No Pcp, MD  / Phone Number: None  [id number] [address] [place] [phone]   Funding/Billing: Payor: [place]/ Plan: [place]  Reason for admission: Voluntary psych    Living arrangements: Alone  Type of Residence: homeless  Pre-Hospitalization self-care deficits: None  Pre-Hospitalization mobility: Modified Independent  Type of home health care services in place: None  DME in place: None  Does the patient have ongoing DC Planning needs?: TBD       Initial 5150 written?: No  [facility]:: [facility] Ph: [phone] [place]Ph: [phone] [place]Ph: [phone] [facility]  Ph: [phone]      Clinical Case Management  Mental Health Referral Coordination Note    Referral Type: Inpatient Psychiatric Placement     Medical Clearance Confirmed? Dr. FMarijean Bravo- 2/9     Functional Status: Indep     Covid Test: 2/9 - Neg     Active Referrals: All listed above      Declined Referrals: None    Comment: Writer spoke to psych team [facility] SW to begin 602 referral on Monday as [facility] is closed over the weekend.     Current Placement Barrier(s): Out of CFirstEnergy Corp BKindred Healthcareavailability     Plan/ to be followed: Involuntary Psychiatric Placement      Signed by:   SLorelle Gibbs  Mental Health  Referral Coordinator  Department of Clinical Case Management  Phone: [phone] Cell: [phone] Active on Tigertext

## 2022-07-12 DIAGNOSIS — F10130 Alcohol abuse with withdrawal, uncomplicated: Secondary | ICD-10-CM

## 2022-07-12 MED ORDER — THIAMINE HCL (VITAMIN B1) 100 MG TABLET
500.0000 mg | ORAL_TABLET | Freq: Every day | ORAL | Status: DC
Start: 2022-07-13 — End: 2022-07-13
  Administered 2022-07-13: 500 mg via ORAL
  Filled 2022-07-12: qty 5

## 2022-07-12 MED ORDER — ACETAMINOPHEN 500 MG TABLET
1000.0000 mg | ORAL_TABLET | Freq: Four times a day (QID) | ORAL | Status: DC | PRN
Start: 2022-07-12 — End: 2022-07-13
  Administered 2022-07-12 (×2): 1000 mg via ORAL
  Filled 2022-07-12 (×2): qty 2

## 2022-07-12 MED ORDER — ONDANSETRON 4 MG DISINTEGRATING TABLET
4.0000 mg | DISINTEGRATING_TABLET | Freq: Once | ORAL | Status: AC
Start: 2022-07-12 — End: 2022-07-12
  Administered 2022-07-12: 4 mg via ORAL
  Filled 2022-07-12: qty 1

## 2022-07-12 MED ORDER — FOLIC ACID 1 MG TABLET
1.0000 mg | ORAL_TABLET | Freq: Every day | ORAL | Status: DC
Start: 2022-07-12 — End: 2022-07-13
  Administered 2022-07-12 – 2022-07-13 (×2): 1 mg via ORAL
  Filled 2022-07-12 (×2): qty 1

## 2022-07-12 MED ORDER — DIAZEPAM 5 MG TABLET
5.0000 mg | ORAL_TABLET | ORAL | Status: DC | PRN
Start: 2022-07-12 — End: 2022-07-12
  Administered 2022-07-12: 10 mg via ORAL
  Filled 2022-07-12: qty 2

## 2022-07-12 MED ORDER — THIAMINE HCL (VITAMIN B1) 100 MG TABLET
100.0000 mg | ORAL_TABLET | Freq: Every day | ORAL | Status: DC
Start: 2022-07-12 — End: 2022-07-12
  Administered 2022-07-12: 100 mg via ORAL
  Filled 2022-07-12: qty 1

## 2022-07-12 MED ORDER — MULTIVITAMIN WITH FOLIC ACID 400 MCG TABLET
1.0000 | ORAL_TABLET | Freq: Every day | ORAL | Status: DC
Start: 2022-07-12 — End: 2022-07-13
  Administered 2022-07-12 – 2022-07-13 (×2): 1 via ORAL
  Filled 2022-07-12 (×2): qty 1

## 2022-07-12 MED ORDER — DIAZEPAM 5 MG TABLET
5.0000 mg | ORAL_TABLET | Freq: Four times a day (QID) | ORAL | Status: DC | PRN
Start: 2022-07-12 — End: 2022-07-13

## 2022-07-12 NOTE — ED Nursing Note (Signed)
Assumed care of pt, for 45 min lunch relief. Pt sleeping, will get VS when pt wakes up.

## 2022-07-12 NOTE — Progress Notes (Signed)
Psychiatry Consult Progress Note  Date of Service: 07/12/2022     Summary   Trevor Mckee is a 20yrmale  hx of CVA, HTN, alcohol and amphetamine abuse, homelessness who initially presented to ED for heart palpitations, later endorsed SI with plan to [place].        Interval   No significant overnight events reported. Per SW, patient was offered [place] he will likely need to return to the streets. Patient [facility] for future reference in patient's discharge summary since patient refused referrals.     Subjective    Patient is calm, cooperative, and greeted at bedside. He says he has been dealing with withdrawal symptoms, including headaches and nausea. He also reports knee pain as well. Patient reports that feels "frustration and anxiety" since he wants to get off the street but having trouble doing so. Patient spoke to SW and understands that it is less likely he will secure housing at this time. He says he has a son that he has not spoken to in the last 7 years, [place] does not want care team to contact them at this time despite offering to do so. Patient says things became worse around 7 years ago when he met a girlfriend who introduced him to substances. He is interested in getting clean and sober. Patient denies [place] as well as thoughts of SI/HI. He denies any side effects from current medication regimen. Says he has been really hungry and has been sleeping a lot, with vivid dreams he relates to his withdrawal symptoms.         Objective    Medications    Folic Acid Tablet 1 mg, ORAL, QAM  Gabapentin (NEURONTIN) Capsule 300 mg, ORAL, BID  Gabapentin (NEURONTIN) Capsule 600 mg, ORAL, Daily Bedtime  Losartan (COZAAR) Tablet 25 mg, ORAL, QAM  Multivitamin (HEXAVITAMIN)  Tablet 1 tablet, ORAL, QAM  Thiamine Tablet 100 mg, ORAL, QAM      Acetaminophen (TYLENOL) Tablet 1,000 mg, ORAL, Q6H PRN        Vitals   Blood pressure (!) 153/104, pulse 61, temperature 36.5 C (97.7 F), temperature source Oral, resp. rate 16, height 1.778 m (5' 10"$ ), weight 105 kg (231 lb 7.7 oz), SpO2 96%.    LAB TESTS/STUDIES    Reviewed recent lab results, with the following pertinent findings/interpretations:  No new lab findings     MENTAL STATUS EXAM:    Appearance:  well-kempt, in hospital bed, wearing green shirt   Behavior:   laying on his side in bed, appropriate eye contact, cooperative on exam   Cognition:  intact, well organized, no overt deficits  Motor: normokinetic, appears tremulous    Speech:  normal rate, normal rhythm, and normal tone  Mood:  "frustrated and anxious"   Affect:  euthymic  Thought Process:  linear and goal directed  Thought Content:  appropriate, wants to find stable housing, interested in getting clean and sober   Insight:  improving   Judgment:  improving       Assessment & Plan       DDong Ullandis a 89yrale  hx of CVA, HTN, alcohol and amphetamine abuse, homelessness who initially presented to ED for heart palpitations, later endorsed SI with plan  to [place].     On interview, patient is calm and cooperative. His cognition seems intact with no overt deficits, as demonstrates improving insight and judgement. Based on vital signs, including hypertension and tremors, patient seems to be undergoing alcohol withdrawal symptoms. Note that delirium tremens can occur up to 10 days after last alcohol consumption. He denies any AH/VH. Med compliant without any side effects.    Patient continues to endorse psychosocial stressors including unstable housing, financial instability, lack of social support leading to low mood and substance use. Patient reports that he has been attempting to obtain stable housing and medical care for quite some time but not  having any success. MSE notable for to goal directed thought process, euthymic affect, good eye contact. Patient denies any SI/HI.  Patient without prior history of psychiatric hospitalizations, suicide attempts, does not have access to firearms, is future oriented low acute risk of suicide at this time. Patient likely not to benefit from inpatient psychiatric hospitalization at this time.     Given specific stressor related to housing contributing to recent suicidal ideation, patient would benefit from connection to resources for housing right crisis residential. SW in the process of finding housing, but seems less likely. Patient refused referrals from Substance Use Navigator team, who [facility]bed emergency shelter for future reference in patient's discharge summary. In light of psychosocial circumstances, hx of circumstantially dependent SI, and refusal for resources and family contact, query secondary gain. Patient last drink 2/9 a.m., history of withdrawal symptoms, plan to monitor on CIWA, restart RASS for alcohol withdrawal. Valium 5-10 mg PRN q2H for alcohol withdrawal symptoms.      DIAGNOSIS  Alcohol Use Disorder  Stimulant Use Disorder        RECOMMENDATIONS  - continue Losartan 25 mg daily for HTN  - CIWA protocol, restart RASS for alcohol withdrawal   - Valium 5-10 mg PRN q2H for withdrawal symptoms   - Thiamine 123XX123 mg daily  - Folic Acid 1 mg daily  - Likely D/C plan tomorrow   - No need for inpatient psychiatry hospitalization at this time    Patient seen with Trevor Mckee, Med Student, who drafted this note, edited and finalized. We will continue to monitor for and manage alcohol withdrawal.   Alcohol/sedative/BZP/barbiturate withdrawal is characterized by hyperadrenergia which may present as delirium. Patients are monitors with CIWAs and benzodiazepines are given in a symptom-driven fashion. Insomnia c an be managed by sedating antidepressants. Patients should receive  500 mg of thiamine daily.

## 2022-07-12 NOTE — Allied Health Progress (Signed)
CLINICAL SOCIAL SERVICES HOMELESS DISCHARGE NOTE    Note Date & Time:  07/12/2022 12:39 Admission Date:  07/09/2022  4:36 PM   Date of Birth:  08/31/1960 Age:  52yr  Date of Service:  07/12/2022 Referred By:  Medical Team   Patient Name:  Trevor NewbillMRN:  [id number]  Gender Identity:   Ethnic Background:     Pronouns:   Ethnic Group:  Not Hispanic or Latino     EMERGENCY CONTACT / NEXT OF KIN       RESOURCES     This patient was offered the following resources:  (P) [place]   The hospital has offered the homeless patient transportation after discharge to a maximum travel time of 30 minutes or a maximum travel distance of 30 miles of the hospital: transport provided     Discharge destination: other desired discharge destination indicated by patient or their representative  [place]   Follow-up behavioral care needed?: No         Additional Information:  Patient with no known mental health history who has been on a 5150 hold for danger to self.  [place] [facility]  He has been using alcohol but states that he could stop at any time if he were housed.  [facility].  At the beginning of our conversation he stated that "I can't do it anymore" but eventually asked me if he was going to have to return to the streets and I told him that I thought so.  I urged him to continue to check in regularly with the program at the [place] that [place] stayed on the wait list.  He stated that the office there closes at 1600 and that he hopes to get there today or tomorrow to check in.  I told him that a SArt gallery managermay be able to see him and he seemed interested.    I consulted with the medical  team and suggested a SUN consult.     Homeless discharge checklist completed:  Yes    Signature:  PMarikay Alar LCSW        Licensed Clinical Social Worker

## 2022-07-12 NOTE — ED Nursing Note (Signed)
Pt states having tremors and nausea. Pt vomited once. Provider notified. Orders placed.

## 2022-07-12 NOTE — Consults (Signed)
SUBSTANCE USE NAVIGATOR                                                        INITIAL ASSESSMENT   Date of Visit  07/09/2022     Note Date and Time  07/12/2022, 14:12    Referred By  @    Reason for Referral  Substance Use Concerns and Substance Use Disorder Treatment Options     Chief Compliant    Chief Complaint   Patient presents with    Palpitations       Language of Care  English    Interpreter        Patient Information was Obtained From  Patient and Chart Review    Financial   No Income    Veteran Status  None    Living Situation  Homeless    Patient Contact Information  Phone: [phone] (home)    Address: [place] [address]    Discharge Information    *Verify same as contact information*   Phone: [phone] (home)    Address: [place] [address]        Demographics     Patient Name  Trevor Mckee    D.O.B./Age  02/25/1961, 64yr    Gender/Ethnicity     Male   The patient's reported race is       White.     He identifies as Not Hispanic or Latino.       Alcohol Use Disorder Identification   (AUDIT Questions)    Currently Intoxicated  No     History of Withdrawal Symptoms  Yes: tremors , nausea and headaches    Last Use  Unknown    Alcohol Type  Liquor    Environment Typically Uses   Alone and With group of people    Attempts to QLubrizol Corporationon own    Longest Period of Sobriety  11 months              BAC    Lab Results   Component Value Date    ETOH <10 07/09/2022     POC Alcohol, Breath: (not recorded)      Drug Abuse Screening Test  (DAST Questions)    Currently Intoxicated  No     History of Withdrawal Symptoms  Unable to Assess: at this time    Intravenous drug use current and past  Unable to assess, at this time    Last Drug Use   2 weeks ago (meth, Smokes)    Environment Typically Use  Alone and With group of people    Attempts to QCon-wayon own    Longest Period of Sobriety  11 moths        Start on Naltrexone   No     Started Buprenorphine   Not  Applicable    COUP survey   Not Applicable    Smokes Cigarettes   Unable to assess                UTOX  @BRIEFLAB$ (BARBSSCRNU,  BENZOSSCRNU, COCAINESCRNU,AMPHETSCRNU,OPIATESSCRU)@        Interventions     Interventions    Referrals to community resources:      Counseling Provided  No  Referral to Alcohol rehab program  No    Placed inpatient AOD Treatment  No           Plan of Action     Met patient via  bedside offered [facility]  Patient declined stating had a past negative experience. Counselor [facility] place [facility]bed emergency shelter for future reference in patient's discharge summary. Patient was agreeable.      Plan of Action:     [facility] [address],   [place], [place] [address]  [facility]  100-bed emergency shelter   Open ? Closes 5:30?PM  Phone: [phone]     Total time spent on patient (5 minute increments): 20    Electronically Signed by:   Vincenza Hews, Psych Tech    Substance Use Navigator

## 2022-07-13 ENCOUNTER — Other Ambulatory Visit: Payer: Self-pay

## 2022-07-13 DIAGNOSIS — R519 Headache, unspecified: Secondary | ICD-10-CM

## 2022-07-13 MED ORDER — LOPERAMIDE 2 MG CAPSULE
2.0000 mg | ORAL_CAPSULE | ORAL | Status: DC | PRN
Start: 2022-07-13 — End: 2022-07-13
  Administered 2022-07-13: 4 mg via ORAL
  Filled 2022-07-13: qty 2

## 2022-07-13 NOTE — Progress Notes (Addendum)
Psychiatry Consult Progress Note  Date of Service: 07/13/2022     Summary   Trevor Mckee is a 48yrmale  hx of CVA, HTN, alcohol and amphetamine abuse, homelessness who initially presented to ED for heart palpitations, later endorsed SI with plan to jump [place].        Interval   Per SW, patient was offered [place] he will likely need to return to the streets. Patient [facility]. [facility] for future reference in patient's discharge summary since patient refused referrals.    - Per nursing, patient stated continued tremors and nausea, vomited once  - PRNs include Valium 126mx1, tylenol 1,00028m  Subjective    Patient states he has been dealing with withdrawal symptoms, including headaches and nausea. Patient reports mood has been a lot better than when he first came in. He says he is intermittently "depressed knowing what he can or cannot do" in terms of his housing situation. He reports being able to rest, talk to people, and not worry about food has helped him a lot. He attributes recent SI to exhaustion, alcohol use. Is now sober and is motivated to stay sober and hopefully find housing. Patient spoke to SW and understands that it is less likely he will secure housing at this time. He denies SI or HI at this time and denies every previously having SI or attempts. Denies AH or VH. He wonders why he is on gabapentin because it makes him drowsy and would like to stop it if not necessary. Withdrawal symptoms better, although sx usually start in the afternoons.  Has been in inpatient detox/rehab for 30 days couple of years ago, it helped.        Objective    Medications    Folic Acid Tablet 1 mg, ORAL, QAM  Gabapentin (NEURONTIN) Capsule 300 mg, ORAL, BID  Gabapentin (NEURONTIN) Capsule 600 mg, ORAL, Daily Bedtime  Losartan  (COZAAR) Tablet 25 mg, ORAL, QAM  Multivitamin (HEXAVITAMIN) Tablet 1 tablet, ORAL, QAM  Thiamine Tablet 500 mg, ORAL, QAM      Acetaminophen (TYLENOL) Tablet 1,000 mg, ORAL, Q6H PRN  Diazepam (VALIUM) Tablet 5 mg, ORAL, Q6H PRN        Vitals   Blood pressure (!) 168/63, pulse 65, temperature 36.6 C (97.9 F), temperature source Oral, resp. rate 16, height 1.778 m (5' 10"$ ), weight 105 kg (231 lb 7.7 oz), SpO2 98%.    LAB TESTS/STUDIES    Reviewed recent lab results, with the following pertinent findings/interpretations:  No new lab findings     MENTAL STATUS EXAM:    Appearance:  well-kempt, in hospital bed, wearing green shirt   Behavior:   laying on his side in bed, appropriate eye contact, cooperative on exam   Cognition:  intact, well organized, no overt deficits  Motor: normokinetic, appears tremulous    Speech:  normal rate, normal rhythm, and normal tone  Mood:  "somewhat depressed"   Affect:  blunted  Thought Process:  linear and goal directed  Thought Content:  appropriate, wants to find stable housing, interested in getting clean and sober   Insight:  improving   Judgment:  improving       Assessment & Plan       Trevor Mckee a [age]yre  hx of  CVA, HTN, alcohol and amphetamine abuse, homelessness who initially presented to ED for heart palpitations, later endorsed SI with plan to [place].     On interview, patient is calm and cooperative. His cognition seems intact with no overt deficits, as demonstrates improving insight and judgement. Based on vital signs, including hypertension and tremors, patient seems to be undergoing alcohol withdrawal symptoms. Note that delirium tremens can occur up to 10 days after last alcohol consumption. Prior h/o withdrawal symptoms include tremors, headaches nausea. Denies seizures or hallucinations. Currently on RASS protocol with Valium 73m q6h prn and Gabapentin. Med compliant without any side effects.    Patient continues to endorse  psychosocial stressors including unstable housing, financial instability, lack of social support leading to low mood and substance use. Patient reports that he has been attempting to obtain stable housing and medical care for quite some time but not having any success. MSE notable for to goal directed thought process, blunted affect, good eye contact. Patient denies any SI/HI. Patient without prior history of psychiatric hospitalizations, suicide attempts, does not have access to firearms, is future oriented low acute risk of suicide at this time. Patient likely not to benefit from inpatient psychiatric hospitalization at this time. Patient has one prior inpatient stay for detox/rehabilitation for 30 days a couple of years ago. Patient reports this stay was very helpful.    Given specific stressor related to housing contributing to recent suicidal ideation, patient would benefit from connection to resources for housing right crisis residential. SW met to provide resources but seems imminently less likely. [facility] due to prior bad experience. Denies SI/HI, is able to provide viable plan/participate in  process for identifying shelter, food, clothing and so does not meet criteria for involuntary hospitalization.      DIAGNOSIS  Alcohol Use Disorder  Stimulant Use Disorder        RECOMMENDATIONS  - continue Losartan 25 mg daily for HTN  - CIWA protocol, restarted RASS for alcohol withdrawal   - Valium 5-10 mg PRN q2H for withdrawal symptoms   - Thiamine 1123XX123mg daily  - Folic Acid 1 mg daily  - Does not meet criteria for inpatient psychiatry hospitalization at this time      This patient was seen, evaluated, and care plan was developed with the resident. I agree with the assessment and plan as outlined in the resident's note. Patient wishes to be DC today to pursue arranging for housing and other social needs.    Report electronically signed by JChristen Butter MD. Attending

## 2022-07-13 NOTE — ED Nursing Note (Signed)
AVS reviewed with patient. Patient verbalized understanding and all questions answered. No IV in place.

## 2022-07-13 NOTE — Nurse Discharge Note (Signed)
Received patient in Discharge Reception Area, Discharge instruction given to patient by bedside nurse. Lyft ride provided to [place].
